# Patient Record
Sex: Female | Born: 1975 | Race: Black or African American | Hispanic: No | Smoking: Never smoker
Health system: Southern US, Community
[De-identification: ages and names within clinical notes are randomized; demographics above are authoritative.]

## PROBLEM LIST (undated history)

## (undated) DIAGNOSIS — Z789 Other specified health status: Secondary | ICD-10-CM

## (undated) DIAGNOSIS — D649 Anemia, unspecified: Secondary | ICD-10-CM

## (undated) HISTORY — PX: NO PAST SURGERIES: SHX2092

---

## 2001-11-20 ENCOUNTER — Emergency Department (HOSPITAL_COMMUNITY): Admission: EM | Admit: 2001-11-20 | Discharge: 2001-11-21 | Payer: Self-pay | Admitting: Emergency Medicine

## 2001-11-21 ENCOUNTER — Encounter: Payer: Self-pay | Admitting: Emergency Medicine

## 2001-11-25 ENCOUNTER — Emergency Department (HOSPITAL_COMMUNITY): Admission: EM | Admit: 2001-11-25 | Discharge: 2001-11-25 | Payer: Self-pay | Admitting: Emergency Medicine

## 2003-01-28 ENCOUNTER — Other Ambulatory Visit: Admission: RE | Admit: 2003-01-28 | Discharge: 2003-01-28 | Payer: Self-pay | Admitting: Gynecology

## 2003-07-04 ENCOUNTER — Inpatient Hospital Stay (HOSPITAL_COMMUNITY): Admission: AD | Admit: 2003-07-04 | Discharge: 2003-07-04 | Payer: Self-pay | Admitting: Obstetrics & Gynecology

## 2003-08-05 ENCOUNTER — Inpatient Hospital Stay (HOSPITAL_COMMUNITY): Admission: AD | Admit: 2003-08-05 | Discharge: 2003-08-07 | Payer: Self-pay | Admitting: Obstetrics & Gynecology

## 2003-08-08 ENCOUNTER — Encounter: Admission: RE | Admit: 2003-08-08 | Discharge: 2003-09-07 | Payer: Self-pay | Admitting: Obstetrics & Gynecology

## 2004-12-05 ENCOUNTER — Other Ambulatory Visit: Admission: RE | Admit: 2004-12-05 | Discharge: 2004-12-05 | Payer: Self-pay | Admitting: Obstetrics and Gynecology

## 2008-03-04 ENCOUNTER — Emergency Department (HOSPITAL_COMMUNITY): Admission: EM | Admit: 2008-03-04 | Discharge: 2008-03-04 | Payer: Self-pay | Admitting: Emergency Medicine

## 2010-12-08 NOTE — H&P (Signed)
Pamela Bernard, Pamela Bernard                           ACCOUNT NO.:  [id number]   MEDICAL RECORD NO.:  [id number]                   PATIENT TYPE:  INP   LOCATION:  9175                                 FACILITY:  [facility]   PHYSICIAN:  Roseanna Rainbow, M.D.         DATE OF BIRTH:  06-09-1976   DATE OF ADMISSION:  08/05/2003  DATE OF DISCHARGE:                                HISTORY & PHYSICAL   CHIEF COMPLAINT:  The patient is a 35 year old, gravida 3, para 1, estimated  date of confinement of July 31, 2003, by an early ultrasound, now with an  intrauterine pregnancy at 40+ weeks, and history of favorable Bishop score,  who presents for induction of labor.   HISTORY OF PRESENT ILLNESS:  Antepartum care initially with [facility], transferred to [place].  Risk factors:  The patient  had a genetic amniocentesis with a normal karyotype.  She has a history of a  preterm delivery complicated by possible postpartum hemorrhage.  She also  developed a labial abscess that required I&D during this pregnancy.  Also  remarkable for mild anemia.  Asymptomatic bacteria.   LABORATORY WORK:  Hemoglobin 11.6, hematocrit 33.6, platelet count 252,000.  Blood type O positive.  Antibody screen negative.  RPR nonreactive.  Rubella  immune.  Hepatitis B surface antigen nonreactive.  HIV nonreactive.   PAST OBSTETRICAL/GYNECOLOGICAL HISTORY:  In December of 1997, she was  delivered of a female 5 pounds 11 ounces at 30+ weeks.  Again, questionable  postpartum hemorrhage.  She has a history of a voluntary termination of  pregnancy at 13 weeks.   PAST MEDICAL HISTORY:  She denies.   PAST SURGICAL HISTORY:  She denies.   FAMILY HISTORY:  Remarkable for chronic hypertension.   SOCIAL HISTORY:  No tobacco, ethanol, or substance abuse.   ALLERGIES:  No known drug allergies.   MEDICATIONS:  Prenatal vitamins and ferrous sulfate.   PHYSICAL EXAMINATION:  VITAL SIGNS:  Stable and afebrile.  GENERAL APPEARANCE:  Well developed.  No apparent distress.  ABDOMEN:  Gravida.  Fetal heart tracing reassuring.  Tocodynamometry with  regular contractions.  PELVIC:  Sterile vaginal exam shows at least 2 cm dilated and 60% effaced.  EXTREMITIES:  No cyanosis, clubbing, or edema.   ASSESSMENT:  1. Intrauterine pregnancy at 40+ weeks.  2. Favorable Bishop score.  3. Fetal heart tracing consistent with fetal well being.   PLAN:  1. Admission.  2. Pitocin induction and augmentation.                                               Roseanna Rainbow, M.D.    Judee Clara  D:  08/05/2003  T:  08/05/2003  Job:  [id number]

## 2011-04-20 LAB — URINE CULTURE: Colony Count: >=100000

## 2011-04-20 LAB — CBC
HCT: 38.4
Hemoglobin: 13.1
MCHC: 34.1
MCV: 87
Platelets: 217
RBC: 4.41
RDW: 12.6
WBC: 10.2

## 2011-04-20 LAB — POCT I-STAT, CHEM 8
BUN: 7
Chloride: 100
HCT: 41
Sodium: 132 — ABNORMAL LOW
TCO2: 27

## 2011-04-20 LAB — DIFFERENTIAL
Basophils Absolute: 0
Basophils Relative: 0
Lymphocytes Relative: 6 — ABNORMAL LOW
Neutro Abs: 8.8 — ABNORMAL HIGH

## 2011-04-20 LAB — URINALYSIS, ROUTINE W REFLEX MICROSCOPIC
Bilirubin Urine: NEGATIVE
Glucose, UA: NEGATIVE
Ketones, ur: 15 — AB
Nitrite: POSITIVE — AB
Protein, ur: 30 — AB
Specific Gravity, Urine: 1.01
Urobilinogen, UA: 4 — ABNORMAL HIGH
pH: 6.5

## 2011-04-20 LAB — CULTURE, BLOOD (ROUTINE X 2)

## 2011-04-20 LAB — URINE MICROSCOPIC-ADD ON

## 2011-04-20 LAB — POCT PREGNANCY, URINE: Preg Test, Ur: NEGATIVE

## 2012-04-24 ENCOUNTER — Emergency Department (INDEPENDENT_AMBULATORY_CARE_PROVIDER_SITE_OTHER): Payer: Self-pay

## 2012-04-24 ENCOUNTER — Encounter (HOSPITAL_COMMUNITY): Payer: Self-pay | Admitting: Emergency Medicine

## 2012-04-24 ENCOUNTER — Emergency Department (INDEPENDENT_AMBULATORY_CARE_PROVIDER_SITE_OTHER)
Admission: EM | Admit: 2012-04-24 | Discharge: 2012-04-24 | Disposition: A | Discharge status: Home / Self Care | Payer: Self-pay | Source: Home / Self Care | Attending: Emergency Medicine | Admitting: Emergency Medicine

## 2012-04-24 DIAGNOSIS — S42023A Displaced fracture of shaft of unspecified clavicle, initial encounter for closed fracture: Secondary | ICD-10-CM

## 2012-04-24 MED ORDER — CYCLOBENZAPRINE HCL 10 MG PO TABS: 10.0000 mg | ORAL_TABLET | Freq: Two times a day (BID) | ORAL | Status: DC | PRN

## 2012-04-24 MED ORDER — HYDROCODONE-IBUPROFEN 7.5-200 MG PO TABS: 1.0000 | ORAL_TABLET | Freq: Three times a day (TID) | ORAL | Status: DC | PRN

## 2012-04-24 NOTE — ED Provider Notes (Signed)
History     CSN: [id number]  Arrival date & time 04/24/12  1057   First MD Initiated Contact with Patient 04/24/12 1058      Chief Complaint  Patient presents with  . Shoulder Pain    (Consider location/radiation/quality/duration/timing/severity/associated sxs/prior treatment) HPI Comments: Patient reports being on ladder yesterday, lost balance and fell to the ground.  Landed on grassy area.  Landed on left shoulder.  Left clavicle deformity.  Denies numbness, no tingling. Obvious deformity to the mid region area of her left clavicle. She did apply some ice yesterday. Patient denies any further symptoms including chest pains, shortness of breath. Able to wiggle her fingers and move her wrist up and down.   Patient is a 36 y.o. female presenting with shoulder pain. The history is provided by the patient.  Shoulder Pain This is a new problem. The problem occurs constantly. The problem has been gradually worsening. Exacerbated by: Movement of arm. Nothing relieves the symptoms. The treatment provided mild relief.    History reviewed. No pertinent past medical history.  History reviewed. No pertinent past surgical history.  No family history on file.  History  Substance Use Topics  . Smoking status: Never Smoker   . Smokeless tobacco: Not on file  . Alcohol Use: No    OB History    Grav Para Term Preterm Abortions TAB SAB Ect Mult Living                  Review of Systems  Constitutional: Negative for chills, activity change and appetite change.  Musculoskeletal: Negative for myalgias, back pain, joint swelling and arthralgias.  Skin: Negative for color change and wound.  Neurological: Negative for weakness and numbness.    Allergies  Review of patient's allergies indicates no known allergies.  Home Medications   Current Outpatient Rx  Name Route Sig Dispense Refill  . IBUPROFEN 200 MG PO TABS Oral Take 800 mg by mouth every 6 (six) hours as needed.      BP  121/85  Pulse 85  Temp 98.4 F (36.9 C) (Oral)  Resp 16  SpO2 99%  LMP 03/23/2012  Physical Exam  Nursing note and vitals reviewed. Constitutional: She appears well-developed and well-nourished. No distress.  Pulmonary/Chest: Effort normal and breath sounds normal.  Musculoskeletal: She exhibits tenderness.       Left shoulder: She exhibits decreased range of motion.       Arms: Neurological: She is alert.  Skin: Skin is warm. No rash noted. No erythema.    ED Course  Procedures (including critical care time)  Labs Reviewed - No data to display Dg Clavicle Left  04/24/2012  *RADIOLOGY REPORT*  Clinical Data: Post fall onto left shoulder yesterday  LEFT CLAVICLE - 2+ VIEWS  Comparison: None.  Findings: There is a comminuted, displaced fracture of the mid aspect of the left clavicle with approximately 1.5 cm of foreshortening of the fracture fragments.  There is an approximately osseous fragment adjacent to the fracture site which measures approximately 3.5 cm in length.  There is expected adjacent soft tissue swelling.  No radiopaque foreign body. Limited visualization of the adjacent thorax is normal.  IMPRESSION: Comminuted, displaced fracture of the mid aspect of the left clavicle.   Original Report Authenticated By: Waynard Reeds, M.D.      No diagnosis found.    MDM   Displaced and angulated midshaft clavicle fracture. Have discussed management and followup with Dr. Ranell Patrick, he will discuss further  treatment options with patient Tuesday. Patient will be placed in a sling, pain management and muscle relaxers and cold therapy patient understands treatment plan and followup care. No neural muscular deficits distally 2 injured Clavicle.       Jimmie Molly, MD 04/24/12 1224

## 2012-04-24 NOTE — ED Notes (Signed)
Left shoulder pain.  Patient reports being on ladder yesterday, lost balance and fell to the ground.  Landed on grassy area.  Landed on left shoulder.  Left clavicle deformity.  Sensation normal in left hand, no numbness, no tingling, pulses 2 plus left radial pulse.  Fingers warm to touch

## 2012-05-07 ENCOUNTER — Encounter (HOSPITAL_COMMUNITY): Payer: Self-pay | Admitting: *Deleted

## 2012-05-07 ENCOUNTER — Encounter (HOSPITAL_COMMUNITY): Payer: Self-pay

## 2012-05-08 MED ORDER — CEFAZOLIN SODIUM-DEXTROSE 2-3 GM-% IV SOLR
2.0000 g | INTRAVENOUS | Status: AC
  Administered 2012-05-09: 2 g via INTRAVENOUS
  Filled 2012-05-08: qty 50

## 2012-05-08 NOTE — H&P (Signed)
CC: left shoulder pain HPI: pt fell about 2 weeks ago onto left shoulder and diagnosed with left clavicle fracture Pt has had continued moderate pain with obvious deformity and tenting of the skin over the left clavicle PMH: negative Social: no smoker, occasional etoh Family: non  Contributory ROS: pain with rom left shoulder otherwise negative PE: alert and appropriate 37 y/o female in no acute distress Cervical spine: full rom cranial nerves 2-12 intact Left shoulder: obvious deformity left midshaft clavicle fracture  nv intact distally Good rom below 90 degrees with left upper extremity Chest active breath sounds bilaterally nv intact bilateral upper extremities X-rays: left clavicle fracture with shortening and displacement Assessment: left clavicle fracture with shortening and displacement Plan: ORIF pin vs plate left clavicle

## 2012-05-09 ENCOUNTER — Ambulatory Visit (HOSPITAL_COMMUNITY): Payer: Self-pay | Admitting: Certified Registered Nurse Anesthetist

## 2012-05-09 ENCOUNTER — Ambulatory Visit (HOSPITAL_COMMUNITY): Payer: Self-pay

## 2012-05-09 ENCOUNTER — Encounter (HOSPITAL_COMMUNITY)
Admission: RE | Disposition: A | Discharge status: Home / Self Care | Payer: Self-pay | Source: Ambulatory Visit | Attending: Orthopedic Surgery

## 2012-05-09 ENCOUNTER — Encounter (HOSPITAL_COMMUNITY): Payer: Self-pay | Admitting: *Deleted

## 2012-05-09 ENCOUNTER — Encounter (HOSPITAL_COMMUNITY): Payer: Self-pay | Admitting: Certified Registered Nurse Anesthetist

## 2012-05-09 ENCOUNTER — Ambulatory Visit (HOSPITAL_COMMUNITY)
Admission: RE | Admit: 2012-05-09 | Discharge: 2012-05-09 | Disposition: A | Discharge status: Home / Self Care | Payer: Self-pay | Source: Ambulatory Visit | Attending: Orthopedic Surgery | Admitting: Orthopedic Surgery

## 2012-05-09 DIAGNOSIS — W19XXXA Unspecified fall, initial encounter: Secondary | ICD-10-CM | POA: Insufficient documentation

## 2012-05-09 DIAGNOSIS — S42023A Displaced fracture of shaft of unspecified clavicle, initial encounter for closed fracture: Secondary | ICD-10-CM | POA: Insufficient documentation

## 2012-05-09 HISTORY — DX: Other specified health status: Z78.9

## 2012-05-09 HISTORY — PX: ORIF CLAVICULAR FRACTURE: SHX5055

## 2012-05-09 LAB — CBC
HCT: 41.6 % (ref 36.0–46.0)
Hemoglobin: 13.9 g/dL (ref 12.0–15.0)
MCV: 89.8 fL (ref 78.0–100.0)
RDW: 12.4 % (ref 11.5–15.5)
WBC: 6.7 10*3/uL (ref 4.0–10.5)

## 2012-05-09 SURGERY — OPEN REDUCTION INTERNAL FIXATION (ORIF) CLAVICULAR FRACTURE
Anesthesia: General | Site: Shoulder | Laterality: Left | Wound class: Clean

## 2012-05-09 MED ORDER — HYDROMORPHONE HCL PF 1 MG/ML IJ SOLN
INTRAMUSCULAR | Status: AC
  Administered 2012-05-09: 0.5 mg via INTRAVENOUS
  Filled 2012-05-09: qty 1

## 2012-05-09 MED ORDER — GLYCOPYRROLATE 0.2 MG/ML IJ SOLN
INTRAMUSCULAR | Status: DC | PRN
  Administered 2012-05-09: 0.6 mg via INTRAVENOUS

## 2012-05-09 MED ORDER — FENTANYL CITRATE 0.05 MG/ML IJ SOLN
INTRAMUSCULAR | Status: AC
  Filled 2012-05-09: qty 2

## 2012-05-09 MED ORDER — LIDOCAINE HCL (CARDIAC) 20 MG/ML IV SOLN
INTRAVENOUS | Status: DC | PRN
  Administered 2012-05-09: 40 mg via INTRAVENOUS

## 2012-05-09 MED ORDER — ACETAMINOPHEN 10 MG/ML IV SOLN: 1000.0000 mg | Freq: Once | INTRAVENOUS | Status: DC

## 2012-05-09 MED ORDER — CHLORHEXIDINE GLUCONATE 4 % EX LIQD: 60.0000 mL | Freq: Once | CUTANEOUS | Status: DC

## 2012-05-09 MED ORDER — MIDAZOLAM HCL 2 MG/2ML IJ SOLN
INTRAMUSCULAR | Status: AC
  Filled 2012-05-09: qty 4

## 2012-05-09 MED ORDER — MIDAZOLAM HCL 2 MG/2ML IJ SOLN: 1.0000 mg | INTRAMUSCULAR | Status: DC | PRN

## 2012-05-09 MED ORDER — KETOROLAC TROMETHAMINE 30 MG/ML IJ SOLN
30.0000 mg | Freq: Once | INTRAMUSCULAR | Status: AC
  Administered 2012-05-09: 30 mg via INTRAVENOUS
  Filled 2012-05-09: qty 1

## 2012-05-09 MED ORDER — OXYCODONE-ACETAMINOPHEN 5-325 MG PO TABS: 1.0000 | ORAL_TABLET | ORAL | Status: DC | PRN

## 2012-05-09 MED ORDER — OXYCODONE HCL 5 MG/5ML PO SOLN: 5.0000 mg | Freq: Once | ORAL | Status: DC | PRN

## 2012-05-09 MED ORDER — NEOSTIGMINE METHYLSULFATE 1 MG/ML IJ SOLN
INTRAMUSCULAR | Status: DC | PRN
  Administered 2012-05-09: 4 mg via INTRAVENOUS

## 2012-05-09 MED ORDER — MIDAZOLAM HCL 2 MG/2ML IJ SOLN
INTRAMUSCULAR | Status: AC
  Filled 2012-05-09: qty 2

## 2012-05-09 MED ORDER — ROCURONIUM BROMIDE 100 MG/10ML IV SOLN
INTRAVENOUS | Status: DC | PRN
  Administered 2012-05-09: 40 mg via INTRAVENOUS

## 2012-05-09 MED ORDER — OXYCODONE HCL 5 MG PO TABS: 5.0000 mg | ORAL_TABLET | Freq: Once | ORAL | Status: DC | PRN

## 2012-05-09 MED ORDER — FENTANYL CITRATE 0.05 MG/ML IJ SOLN: 50.0000 ug | Freq: Once | INTRAMUSCULAR | Status: DC

## 2012-05-09 MED ORDER — ESMOLOL HCL 10 MG/ML IV SOLN
INTRAVENOUS | Status: DC | PRN
  Administered 2012-05-09: 10 mg via INTRAVENOUS

## 2012-05-09 MED ORDER — ONDANSETRON HCL 4 MG/2ML IJ SOLN
INTRAMUSCULAR | Status: DC | PRN
  Administered 2012-05-09: 4 mg via INTRAVENOUS

## 2012-05-09 MED ORDER — BUPIVACAINE-EPINEPHRINE 0.25% -1:200000 IJ SOLN
INTRAMUSCULAR | Status: DC | PRN
  Administered 2012-05-09: 5 mL

## 2012-05-09 MED ORDER — BUPIVACAINE-EPINEPHRINE PF 0.25-1:200000 % IJ SOLN
INTRAMUSCULAR | Status: AC
  Filled 2012-05-09: qty 30

## 2012-05-09 MED ORDER — LACTATED RINGERS IV SOLN
INTRAVENOUS | Status: DC
  Administered 2012-05-09: 12:00:00 via INTRAVENOUS

## 2012-05-09 MED ORDER — METHOCARBAMOL 500 MG PO TABS: 500.0000 mg | ORAL_TABLET | Freq: Three times a day (TID) | ORAL | Status: DC | PRN

## 2012-05-09 MED ORDER — HYDROMORPHONE HCL PF 1 MG/ML IJ SOLN
0.2500 mg | INTRAMUSCULAR | Status: DC | PRN
  Administered 2012-05-09 (×3): 0.5 mg via INTRAVENOUS

## 2012-05-09 MED ORDER — PROPOFOL 10 MG/ML IV BOLUS
INTRAVENOUS | Status: DC | PRN
  Administered 2012-05-09: 180 mg via INTRAVENOUS

## 2012-05-09 MED ORDER — BUPIVACAINE-EPINEPHRINE PF 0.5-1:200000 % IJ SOLN
INTRAMUSCULAR | Status: DC | PRN
  Administered 2012-05-09: 25 mL

## 2012-05-09 MED ORDER — ARTIFICIAL TEARS OP OINT
TOPICAL_OINTMENT | OPHTHALMIC | Status: DC | PRN
  Administered 2012-05-09: 1 via OPHTHALMIC

## 2012-05-09 MED ORDER — PROMETHAZINE HCL 25 MG/ML IJ SOLN: 6.2500 mg | INTRAMUSCULAR | Status: DC | PRN

## 2012-05-09 MED ORDER — DEXAMETHASONE SODIUM PHOSPHATE 4 MG/ML IJ SOLN
INTRAMUSCULAR | Status: DC | PRN
  Administered 2012-05-09: 4 mg

## 2012-05-09 MED ORDER — LACTATED RINGERS IV SOLN
INTRAVENOUS | Status: DC | PRN
  Administered 2012-05-09 (×2): via INTRAVENOUS

## 2012-05-09 MED ORDER — FENTANYL CITRATE 0.05 MG/ML IJ SOLN
INTRAMUSCULAR | Status: DC | PRN
  Administered 2012-05-09: 50 ug via INTRAVENOUS
  Administered 2012-05-09: 25 ug via INTRAVENOUS

## 2012-05-09 MED ORDER — MIDAZOLAM HCL 5 MG/5ML IJ SOLN
INTRAMUSCULAR | Status: DC | PRN
  Administered 2012-05-09: 2 mg via INTRAVENOUS

## 2012-05-09 MED ORDER — MUPIROCIN 2 % EX OINT
TOPICAL_OINTMENT | Freq: Two times a day (BID) | CUTANEOUS | Status: DC
  Administered 2012-05-09: 1 via NASAL
  Filled 2012-05-09 (×2): qty 22

## 2012-05-09 SURGICAL SUPPLY — 45 items
BIT DRILL 3.2 STERILE (BIT) ×1
BIT DRILL 3.2MM STRL (BIT) ×1 IMPLANT
BIT DRILL Q COUPLING 4.5 (BIT) IMPLANT
BIT DRILL Q/COUPLING 1 (BIT) IMPLANT
CLEANER TIP ELECTROSURG 2X2 (MISCELLANEOUS) ×2 IMPLANT
CLOTH BEACON ORANGE TIMEOUT ST (SAFETY) ×2 IMPLANT
DRAPE C-ARM 42X72 X-RAY (DRAPES) ×2 IMPLANT
DRAPE INCISE IOBAN 66X45 STRL (DRAPES) IMPLANT
DRAPE U-SHAPE 47X51 STRL (DRAPES) ×2 IMPLANT
DRILL BIT 3.2MM STERILE (BIT) ×1
DRSG EMULSION OIL 3X3 NADH (GAUZE/BANDAGES/DRESSINGS) ×2 IMPLANT
DRSG PAD ABDOMINAL 8X10 ST (GAUZE/BANDAGES/DRESSINGS) ×2 IMPLANT
ELECT NEEDLE TIP 2.8 STRL (NEEDLE) ×2 IMPLANT
ELECT REM PT RETURN 9FT ADLT (ELECTROSURGICAL) ×2
ELECTRODE REM PT RTRN 9FT ADLT (ELECTROSURGICAL) ×1 IMPLANT
GLOVE BIOGEL PI ORTHO PRO 7.5 (GLOVE) ×1
GLOVE BIOGEL PI ORTHO PRO SZ8 (GLOVE) ×1
GLOVE ORTHO TXT STRL SZ7.5 (GLOVE) ×2 IMPLANT
GLOVE PI ORTHO PRO STRL 7.5 (GLOVE) ×1 IMPLANT
GLOVE PI ORTHO PRO STRL SZ8 (GLOVE) ×1 IMPLANT
GLOVE SURG ORTHO 8.5 STRL (GLOVE) ×2 IMPLANT
GOWN STRL NON-REIN LRG LVL3 (GOWN DISPOSABLE) IMPLANT
GOWN STRL REIN XL XLG (GOWN DISPOSABLE) ×6 IMPLANT
KIT BASIN OR (CUSTOM PROCEDURE TRAY) ×2 IMPLANT
KIT ROOM TURNOVER OR (KITS) ×2 IMPLANT
MANIFOLD NEPTUNE II (INSTRUMENTS) ×2 IMPLANT
NEEDLE 22X1 1/2 (OR ONLY) (NEEDLE) IMPLANT
NEEDLE HYPO 25GX1X1/2 BEV (NEEDLE) ×2 IMPLANT
NS IRRIG 1000ML POUR BTL (IV SOLUTION) ×2 IMPLANT
PACK SHOULDER (CUSTOM PROCEDURE TRAY) ×2 IMPLANT
PAD ARMBOARD 7.5X6 YLW CONV (MISCELLANEOUS) ×4 IMPLANT
PIN CLAVICLE ASSEMBLY 3.0MM (Pin) ×2 IMPLANT
SLING ARM FOAM STRAP LRG (SOFTGOODS) ×2 IMPLANT
SPONGE GAUZE 4X4 12PLY (GAUZE/BANDAGES/DRESSINGS) ×2 IMPLANT
SPONGE LAP 4X18 X RAY DECT (DISPOSABLE) ×4 IMPLANT
STRIP CLOSURE SKIN 1/2X4 (GAUZE/BANDAGES/DRESSINGS) ×2 IMPLANT
SUCTION FRAZIER TIP 10 FR DISP (SUCTIONS) ×2 IMPLANT
SUT MNCRL AB 4-0 PS2 18 (SUTURE) ×2 IMPLANT
SUT VIC AB 2-0 CT1 27 (SUTURE) ×1
SUT VIC AB 2-0 CT1 TAPERPNT 27 (SUTURE) ×1 IMPLANT
SUT VICRYL 0 CT 1 36IN (SUTURE) IMPLANT
SYR CONTROL 10ML LL (SYRINGE) ×2 IMPLANT
TOWEL OR 17X24 6PK STRL BLUE (TOWEL DISPOSABLE) ×2 IMPLANT
TOWEL OR 17X26 10 PK STRL BLUE (TOWEL DISPOSABLE) ×2 IMPLANT
WATER STERILE IRR 1000ML POUR (IV SOLUTION) ×2 IMPLANT
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
## 2012-05-09 NOTE — Anesthesia Preprocedure Evaluation (Addendum)
Anesthesia Evaluation  Patient identified by MRN, date of birth, ID band Patient awake    Reviewed: Allergy & Precautions, H&P , NPO status , Patient's Chart, lab work & pertinent test results  History of Anesthesia Complications (+) PROLONGED EMERGENCE  Airway Mallampati: I TM Distance: >3 FB Neck ROM: Full    Dental   Pulmonary  breath sounds clear to auscultation        Cardiovascular Rhythm:Regular Rate:Normal     Neuro/Psych    GI/Hepatic   Endo/Other    Renal/GU      Musculoskeletal   Abdominal   Peds  Hematology   Anesthesia Other Findings   Reproductive/Obstetrics                          Anesthesia Physical Anesthesia Plan  ASA: I  Anesthesia Plan: General   Post-op Pain Management:    Induction: Intravenous  Airway Management Planned: Oral ETT  Additional Equipment:   Intra-op Plan:   Post-operative Plan: Extubation in OR  Informed Consent: I have reviewed the patients History and Physical, chart, labs and discussed the procedure including the risks, benefits and alternatives for the proposed anesthesia with the patient or authorized representative who has indicated his/her understanding and acceptance.     Plan Discussed with: CRNA and Surgeon  Anesthesia Plan Comments:         Anesthesia Quick Evaluation

## 2012-05-09 NOTE — Anesthesia Procedure Notes (Signed)
Anesthesia Regional Block:  Interscalene brachial plexus block  Pre-Anesthetic Checklist: ,, timeout performed, Correct Patient, Correct Site, Correct Laterality, Correct Procedure, Correct Position, site marked, Risks and benefits discussed,  Surgical consent,  Pre-op evaluation,  At surgeon's request and post-op pain management  Laterality: Left  Prep: chloraprep       Needles:  Injection technique: Single-shot  Needle Type: Echogenic Stimulator Needle     Needle Length: 5cm 5 cm Needle Gauge: 22 and 22 G    Additional Needles:  Procedures: ultrasound guided and nerve stimulator Interscalene brachial plexus block  Nerve Stimulator or Paresthesia:  Response: 0.48 mA,   Additional Responses:   Narrative:  Start time: 05/09/2012 12:17 PM End time: 05/09/2012 12:35 PM Injection made incrementally with aspirations every 5 mL. Anesthesiologist: Dr Gypsy Balsam  Additional Notes: 2440-1027 L ISB POP CHG prpe, sterile tech #22 stim needle/US needle with stim down to .48ma and good [place] visualization Pix in chart Marc .5% w/epi 25cc+ decadron 4mg  infiltrated Multiple neg asp No compl Dr Gypsy Balsam    Interscalene brachial plexus block

## 2012-05-09 NOTE — Brief Op Note (Signed)
05/09/2012  3:03 PM  PATIENT:  Champ Mungo  36 y.o. female  PRE-OPERATIVE DIAGNOSIS:  LEFT CLAVICLE FRACTURE, DISPLACED AND COMMINUTED AND SHORTENED   POST-OPERATIVE DIAGNOSIS:  LEFT CLAVICLE FRACTURE, DISPLACED AND COMMINUTED AND SHORTENED  PROCEDURE:  Procedure(s) (LRB) with comments: OPEN REDUCTION INTERNAL FIXATION (ORIF) CLAVICULAR FRACTURE (Left) - clavicle pinning DePuy clavicle pin  SURGEON:  Surgeon(s) and Role:    * Verlee Rossetti, MD - Primary  PHYSICIAN ASSISTANT:   ASSISTANTS: Thea Gist, PA-C   ANESTHESIA:   local, regional and general  EBL:  Total I/O In: 1000 [I.V.:1000] Out: -   BLOOD ADMINISTERED:none  DRAINS: none   LOCAL MEDICATIONS USED:  MARCAINE     SPECIMEN:  No Specimen  DISPOSITION OF SPECIMEN:  N/A  COUNTS:  YES  TOURNIQUET:  * No tourniquets in log *  DICTATION: .Other Dictation: Dictation Number [id number]  PLAN OF CARE: Discharge to home after PACU  PATIENT DISPOSITION:  PACU - hemodynamically stable.   Delay start of Pharmacological VTE agent (>24hrs) due to surgical blood loss or risk of bleeding: not applicable

## 2012-05-09 NOTE — Transfer of Care (Signed)
Immediate Anesthesia Transfer of Care Note  Patient: Pamela Bernard  Procedure(s) Performed: Procedure(s) (LRB) with comments: OPEN REDUCTION INTERNAL FIXATION (ORIF) CLAVICULAR FRACTURE (Left) - clavicle pinning  Patient Location: PACU  Anesthesia Type: General  Level of Consciousness: awake, alert  and oriented  Airway & Oxygen Therapy: Patient Spontanous Breathing and Patient connected to nasal cannula oxygen  Post-op Assessment: Report given to PACU RN, Post -op Vital signs reviewed and stable and Patient moving all extremities X 4  Post vital signs: Reviewed and stable  Complications: No apparent anesthesia complications

## 2012-05-09 NOTE — Progress Notes (Signed)
Belly button ring given to volunteer Britta Mccreedy at [employer] area volunteer desk states they will give to patients family upon return to [place] waiting area
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
## 2012-05-09 NOTE — Anesthesia Postprocedure Evaluation (Signed)
Anesthesia Post Note  Patient: Pamela Bernard  Procedure(s) Performed: Procedure(s) (LRB): OPEN REDUCTION INTERNAL FIXATION (ORIF) CLAVICULAR FRACTURE (Left)  Anesthesia type: general  Patient location: PACU  Post pain: Pain level controlled  Post assessment: Patient's Cardiovascular Status Stable  Last Vitals:  Filed Vitals:   05/09/12 1555  BP: 111/75  Pulse: 88  Temp:   Resp: 16    Post vital signs: Reviewed and stable  Level of consciousness: sedated  Complications: No apparent anesthesia complications

## 2012-05-09 NOTE — Interval H&P Note (Signed)
History and Physical Interval Note:  05/09/2012 12:37 PM  Pamela Bernard  has presented today for surgery, with the diagnosis of LEFT CLAVICLE FRACTURE  The various methods of treatment have been discussed with the patient and family. After consideration of risks, benefits and other options for treatment, the patient has consented to  Procedure(s) (LRB) with comments: OPEN REDUCTION INTERNAL FIXATION (ORIF) CLAVICULAR FRACTURE (Left) as a surgical intervention .  The patient's history has been reviewed, patient examined, no change in status, stable for surgery.  I have reviewed the patient's chart and labs.  Questions were answered to the patient's satisfaction.     [name],STEVEN R

## 2012-05-10 NOTE — Op Note (Signed)
NAMEBALEY, SHANDS NO.:  [id number]  MEDICAL RECORD NO.:  [id number]  LOCATION:  [facility]                         FACILITY:  [facility]  PHYSICIAN:  Almedia Balls. Ranell Patrick, M.D. DATE OF BIRTH:  [birth year]  DATE OF PROCEDURE:  05/09/2012 DATE OF DISCHARGE:  05/09/2012                              OPERATIVE REPORT   PREOPERATIVE DIAGNOSIS:  Displaced comminuted and shortened left clavicle fracture.  POSTOPERATIVE DIAGNOSIS:  Displaced comminuted and shortened left clavicle fracture.  PROCEDURE PERFORMED:  Open reduction and internal fixation using a DePuy IM pin, left clavicle fracture.  ATTENDING SURGEON:  Almedia Balls. Ranell Patrick, M.D.  ASSISTANT:  Donnie Coffin. Dixon, PA-C who was scrubbed during the entire procedure and necessary for satisfactory completion of surgery.  ANESTHESIA:  General anesthesia was used plus interscalene block and local anesthesia.  ESTIMATED BLOOD LOSS:  Minimal.  FLUID REPLACEMENT:  1000 mL crystalloid.  INSTRUMENT COUNTS:  Correct.  COMPLICATIONS:  There were no complications.  Perioperative antibiotics given.  INDICATIONS:  The patient is a 36 year old who injured her left clavicle and then fall to the ground.  The patient presented with a shortened comminuted and displaced clavicle fracture with extreme displacement and tenting of the skin.  I discussed the options for management with the patient.  She elected to proceed with surgical management.  Risks and benefits of surgery, nonsurgical options were discussed.  Informed consent was obtained.  DESCRIPTION OF PROCEDURE:  After adequate level of anesthesia achieved, the patient was positioned in modified beach-chair position.  Left shoulder correctly identified, sterilely prepped, and draped in usual manner.  Time-out called.  We entered the shoulder using Langer's line skin incision directly overlying the fracture site which was junction middle and medial third of the clavicle.   Dissection down to subcutaneous tissues using Metzenbaum.  We identified the fractured clavicle.  We draped the C-arm into the field.  We drilled the medial and lateral fragments with 3.2 drill bit and then tapped with a 3.0 DePuy clavicle tap.  We then went ahead and retrograded the clavicle pin out the lateral side, reduced the fracture, and then advanced the pin across the fracture site gaining anatomic reduction.  We placed our medial and lateral nuts, clipped the pin, flushed with the lateral nut, and then rasped that smooth with the clavicle in excellent alignment and pinned in position up against the posterior clavicle.  We thoroughly irrigated all wounds.  We then took available bone graft from the butterfly fragment that was removed that was basically vertical, and sticking up into the skin we took that out and morselized that to place in the defect area underneath the fracture site.  There is good muscular coverage all around the clavicle.  Repaired the muscular layer with interrupted 0 Vicryl suture followed by 2-0 Vicryl subcutaneous closure and 4-0 Monocryl for skin.  Steri-Strips applied followed by sterile dressing and shoulder sling.  The patient tolerated the procedure well.     Almedia Balls. Ranell Patrick, M.D.     SRN/MEDQ  D:  05/09/2012  T:  05/10/2012  Job:  [id number]

## 2012-05-13 ENCOUNTER — Encounter (HOSPITAL_COMMUNITY): Payer: Self-pay | Admitting: Orthopedic Surgery

## 2012-05-15 ENCOUNTER — Encounter (HOSPITAL_COMMUNITY): Payer: Self-pay

## 2012-12-31 ENCOUNTER — Encounter (HOSPITAL_COMMUNITY): Payer: Self-pay | Admitting: Pharmacy Technician

## 2013-01-06 ENCOUNTER — Encounter (HOSPITAL_COMMUNITY)
Admission: RE | Admit: 2013-01-06 | Discharge: 2013-01-06 | Disposition: A | Discharge status: Home / Self Care | Payer: Self-pay | Source: Ambulatory Visit | Attending: Orthopedic Surgery | Admitting: Orthopedic Surgery

## 2013-01-06 ENCOUNTER — Encounter (HOSPITAL_COMMUNITY): Payer: Self-pay

## 2013-01-06 HISTORY — DX: Anemia, unspecified: D64.9

## 2013-01-06 LAB — CBC
HCT: 40 % (ref 36.0–46.0)
Hemoglobin: 13.2 g/dL (ref 12.0–15.0)
MCH: 29.7 pg (ref 26.0–34.0)
RBC: 4.45 MIL/uL (ref 3.87–5.11)

## 2013-01-06 LAB — HCG, SERUM, QUALITATIVE: Preg, Serum: NEGATIVE

## 2013-01-06 LAB — SURGICAL PCR SCREEN
MRSA, PCR: NEGATIVE
Staphylococcus aureus: NEGATIVE

## 2013-01-06 NOTE — Pre-Procedure Instructions (Addendum)
Lenyx Boody  01/06/2013   Your procedure is scheduled on:  01/09/13  Report to [facility] at 930AM.  Call this number if you have problems the morning of surgery: [phone]   Remember:   Do not eat food or drink liquids after midnight.   Take these medicines the morning of surgery with A SIP OF WATER:none   Do not wear jewelry, make-up or nail polish.  Do not wear lotions, powders, or perfumes. You may wear deodorant.  Do not shave 48 hours prior to surgery. Men may shave face and neck.  Do not bring valuables to the hospital.  [facility] is not responsible                   for any belongings or valuables.  Contacts, dentures or bridgework may not be worn into surgery.  Leave suitcase in the car. After surgery it may be brought to your room.  For patients admitted to the hospital, checkout time is 11:00 AM the day of  discharge.   Patients discharged the day of surgery will not be allowed to drive  home.  Name and phone number of your driver:  Special Instructions: Shower using CHG 2 nights before surgery and the night before surgery.  If you shower the day of surgery use CHG.  Use special wash - you have one bottle of CHG for all showers.  You should use approximately 1/3 of the bottle for each shower.   Please read over the following fact sheets that you were given: Pain Booklet, Coughing and Deep Breathing, MRSA Information and Surgical Site Infection Prevention

## 2013-01-07 NOTE — H&P (Signed)
Pamela Bernard is an 37 y.o. female.    Chief Complaint: left shoulder injury  HPI: Pt is a 37 y.o. female with a left clavicle fracture requiring clavicle pin placement. Patient has healed her fracture and now presents for clavicle pin removal. Denies any other symptoms or issues  PCP:  No primary provider on file.  D/C Plans:  Home   PMH: Past Medical History  Diagnosis Date  . No pertinent past medical history   . Anemia     hx    PSH: Past Surgical History  Procedure Laterality Date  . No past surgeries    . Orif clavicular fracture  05/09/2012    Procedure: OPEN REDUCTION INTERNAL FIXATION (ORIF) CLAVICULAR FRACTURE;  Surgeon: Verlee Rossetti, MD;  Location: [facility] OR;  Service: Orthopedics;  Laterality: Left;  clavicle pinning    Social History:  reports that she has never smoked. She does not have any smokeless tobacco history on file. She reports that she does not drink alcohol or use illicit drugs.  Allergies:  No Known Allergies  Medications: No current facility-administered medications for this encounter.   No current outpatient prescriptions on file.    Results for orders placed during the hospital encounter of 01/06/13 (from the past 48 hour(s))  SURGICAL PCR SCREEN     Status: None   Collection Time    01/06/13  8:22 AM      Result Value Range   MRSA, PCR NEGATIVE  NEGATIVE   Staphylococcus aureus NEGATIVE  NEGATIVE   Comment:            The Xpert SA Assay (FDA     approved for NASAL specimens     in patients over 2 years of age),     is one component of     a comprehensive surveillance     program.  Test performance has     been validated by [employer] for patients greater     than or equal to 45 year old.     It is not intended     to diagnose infection nor to     guide or monitor treatment.  CBC     Status: None   Collection Time    01/06/13  8:28 AM      Result Value Range   WBC 5.4  4.0 - 10.5 K/uL   RBC 4.45  3.87 - 5.11 MIL/uL   Hemoglobin 13.2  12.0 - 15.0 g/dL   HCT 96.2  95.2 - 84.1 %   MCV 89.9  78.0 - 100.0 fL   MCH 29.7  26.0 - 34.0 pg   MCHC 33.0  30.0 - 36.0 g/dL   RDW 32.4  40.1 - 02.7 %   Platelets 194  150 - 400 K/uL  HCG, SERUM, QUALITATIVE     Status: None   Collection Time    01/06/13  8:28 AM      Result Value Range   Preg, Serum NEGATIVE  NEGATIVE   Comment:            THE SENSITIVITY OF THIS     METHODOLOGY IS >10 mIU/mL.   No results found.  ROS: Pain with rom of the left upper extremity  Physical Exam: Alert and oriented 37 y.o. female in no acute distress Cranial nerves 2-12 intact Cervical spine: full rom with no tenderness, nv intact distally Chest: active breath sounds bilaterally, no wheeze rhonchi or rales Heart: regular rate  and rhythm, no murmur Abd: non tender non distended with active bowel sounds Hip is stable with rom  Good rom of left upper extremity nv intact distally No rashes or edema  Assessment/Plan Assessment: healed left clavicle fracture  Plan: Patient will undergo a left clavicle pin removal by Dr. Ranell Patrick at [facility]. Risks benefits and expectations were discussed with the patient. Patient understand risks, benefits and expectations and wishes to proceed.

## 2013-01-08 MED ORDER — CEFAZOLIN SODIUM-DEXTROSE 2-3 GM-% IV SOLR
2.0000 g | INTRAVENOUS | Status: AC
  Administered 2013-01-09: 2 g via INTRAVENOUS
  Filled 2013-01-08: qty 50

## 2013-01-08 NOTE — Progress Notes (Signed)
Surgery time has changed again.  Left message on pt's voicemail instructing her to be here at 10:30am tomorrow.  Asked patient to call front desk to let us know that she got the message.

## 2013-01-08 NOTE — Progress Notes (Signed)
Patient notified of surgery time change.  Pt instructed to be here at Short Stay at 11:30 tomorrow.

## 2013-01-09 ENCOUNTER — Encounter (HOSPITAL_COMMUNITY): Payer: Self-pay | Admitting: Anesthesiology

## 2013-01-09 ENCOUNTER — Encounter (HOSPITAL_COMMUNITY)
Admission: RE | Disposition: A | Discharge status: Home / Self Care | Payer: Self-pay | Source: Ambulatory Visit | Attending: Orthopedic Surgery

## 2013-01-09 ENCOUNTER — Ambulatory Visit (HOSPITAL_COMMUNITY)
Admission: RE | Admit: 2013-01-09 | Discharge: 2013-01-09 | Disposition: A | Discharge status: Home / Self Care | Payer: MEDICAID | Source: Ambulatory Visit | Attending: Orthopedic Surgery | Admitting: Orthopedic Surgery

## 2013-01-09 ENCOUNTER — Ambulatory Visit (HOSPITAL_COMMUNITY): Payer: Self-pay | Admitting: Anesthesiology

## 2013-01-09 ENCOUNTER — Encounter (HOSPITAL_COMMUNITY): Payer: Self-pay | Admitting: *Deleted

## 2013-01-09 DIAGNOSIS — IMO0001 Reserved for inherently not codable concepts without codable children: Secondary | ICD-10-CM | POA: Insufficient documentation

## 2013-01-09 DIAGNOSIS — Z472 Encounter for removal of internal fixation device: Secondary | ICD-10-CM | POA: Insufficient documentation

## 2013-01-09 HISTORY — PX: HARDWARE REMOVAL: SHX979

## 2013-01-09 SURGERY — REMOVAL, HARDWARE
Anesthesia: Monitor Anesthesia Care | Laterality: Left | Wound class: Clean

## 2013-01-09 MED ORDER — ONDANSETRON HCL 4 MG/2ML IJ SOLN: 4.0000 mg | Freq: Four times a day (QID) | INTRAMUSCULAR | Status: DC | PRN

## 2013-01-09 MED ORDER — OXYCODONE HCL 5 MG/5ML PO SOLN: 5.0000 mg | Freq: Once | ORAL | Status: AC | PRN

## 2013-01-09 MED ORDER — KETOROLAC TROMETHAMINE 30 MG/ML IJ SOLN
INTRAMUSCULAR | Status: DC | PRN
  Administered 2013-01-09: 30 mg via INTRAVENOUS

## 2013-01-09 MED ORDER — MIDAZOLAM HCL 5 MG/5ML IJ SOLN
INTRAMUSCULAR | Status: DC | PRN
  Administered 2013-01-09: 2 mg via INTRAVENOUS

## 2013-01-09 MED ORDER — FENTANYL CITRATE 0.05 MG/ML IJ SOLN
25.0000 ug | INTRAMUSCULAR | Status: DC | PRN
  Administered 2013-01-09 (×3): 50 ug via INTRAVENOUS

## 2013-01-09 MED ORDER — OXYCODONE HCL 5 MG PO TABS
ORAL_TABLET | ORAL | Status: AC
  Filled 2013-01-09: qty 1

## 2013-01-09 MED ORDER — CHLORHEXIDINE GLUCONATE 4 % EX LIQD: 60.0000 mL | Freq: Once | CUTANEOUS | Status: DC

## 2013-01-09 MED ORDER — PROPOFOL INFUSION 10 MG/ML OPTIME
INTRAVENOUS | Status: DC | PRN
  Administered 2013-01-09: 75 ug/kg/min via INTRAVENOUS

## 2013-01-09 MED ORDER — OXYCODONE HCL 5 MG PO TABS
5.0000 mg | ORAL_TABLET | Freq: Once | ORAL | Status: AC | PRN
  Administered 2013-01-09: 5 mg via ORAL

## 2013-01-09 MED ORDER — HYDROMORPHONE HCL PF 1 MG/ML IJ SOLN
0.2500 mg | INTRAMUSCULAR | Status: DC | PRN
  Administered 2013-01-09 (×2): 0.5 mg via INTRAVENOUS

## 2013-01-09 MED ORDER — LACTATED RINGERS IV SOLN
INTRAVENOUS | Status: DC | PRN
  Administered 2013-01-09: 12:00:00 via INTRAVENOUS

## 2013-01-09 MED ORDER — HYDROMORPHONE HCL PF 1 MG/ML IJ SOLN
INTRAMUSCULAR | Status: AC
  Filled 2013-01-09: qty 1

## 2013-01-09 MED ORDER — ONDANSETRON HCL 4 MG/2ML IJ SOLN
INTRAMUSCULAR | Status: DC | PRN
  Administered 2013-01-09: 4 mg via INTRAVENOUS

## 2013-01-09 MED ORDER — FENTANYL CITRATE 0.05 MG/ML IJ SOLN
INTRAMUSCULAR | Status: DC | PRN
  Administered 2013-01-09 (×3): 50 ug via INTRAVENOUS

## 2013-01-09 MED ORDER — FENTANYL CITRATE 0.05 MG/ML IJ SOLN
INTRAMUSCULAR | Status: AC
  Filled 2013-01-09: qty 4

## 2013-01-09 MED ORDER — LACTATED RINGERS IV SOLN
INTRAVENOUS | Status: DC
  Administered 2013-01-09: 12:00:00 via INTRAVENOUS

## 2013-01-09 MED ORDER — LIDOCAINE HCL (CARDIAC) 20 MG/ML IV SOLN
INTRAVENOUS | Status: DC | PRN
  Administered 2013-01-09: 80 mg via INTRAVENOUS

## 2013-01-09 MED ORDER — BUPIVACAINE-EPINEPHRINE PF 0.25-1:200000 % IJ SOLN
INTRAMUSCULAR | Status: AC
  Filled 2013-01-09: qty 30

## 2013-01-09 MED ORDER — OXYCODONE-ACETAMINOPHEN 5-325 MG PO TABS: 1.0000 | ORAL_TABLET | ORAL | Status: AC | PRN

## 2013-01-09 MED ORDER — BUPIVACAINE-EPINEPHRINE 0.25% -1:200000 IJ SOLN
INTRAMUSCULAR | Status: DC | PRN
  Administered 2013-01-09: 6 mL

## 2013-01-09 MED ORDER — PROPOFOL 10 MG/ML IV BOLUS
INTRAVENOUS | Status: DC | PRN
  Administered 2013-01-09 (×2): 20 mg via INTRAVENOUS

## 2013-01-09 SURGICAL SUPPLY — 49 items
BANDAGE ELASTIC 4 VELCRO ST LF (GAUZE/BANDAGES/DRESSINGS) IMPLANT
BANDAGE ELASTIC 6 VELCRO ST LF (GAUZE/BANDAGES/DRESSINGS) IMPLANT
BANDAGE ESMARK 6X9 LF (GAUZE/BANDAGES/DRESSINGS) IMPLANT
BANDAGE GAUZE ELAST BULKY 4 IN (GAUZE/BANDAGES/DRESSINGS) ×2 IMPLANT
BNDG COHESIVE 4X5 TAN STRL (GAUZE/BANDAGES/DRESSINGS) IMPLANT
BNDG ESMARK 6X9 LF (GAUZE/BANDAGES/DRESSINGS)
CLOTH BEACON ORANGE TIMEOUT ST (SAFETY) ×2 IMPLANT
COVER SURGICAL LIGHT HANDLE (MISCELLANEOUS) ×2 IMPLANT
CUFF TOURNIQUET SINGLE 18IN (TOURNIQUET CUFF) ×2 IMPLANT
CUFF TOURNIQUET SINGLE 24IN (TOURNIQUET CUFF) IMPLANT
CUFF TOURNIQUET SINGLE 34IN LL (TOURNIQUET CUFF) IMPLANT
CUFF TOURNIQUET SINGLE 44IN (TOURNIQUET CUFF) IMPLANT
DRAPE C-ARM 42X72 X-RAY (DRAPES) IMPLANT
DRAPE EXTREMITY T 121X128X90 (DRAPE) IMPLANT
DRAPE INCISE IOBAN 66X45 STRL (DRAPES) IMPLANT
DRAPE LAPAROTOMY T 102X78X121 (DRAPES) ×2 IMPLANT
DRAPE ORTHO SPLIT 77X108 STRL (DRAPES)
DRAPE PROXIMA HALF (DRAPES) IMPLANT
DRAPE SURG ORHT 6 SPLT 77X108 (DRAPES) IMPLANT
DRSG EMULSION OIL 3X3 NADH (GAUZE/BANDAGES/DRESSINGS) ×2 IMPLANT
DRSG PAD ABDOMINAL 8X10 ST (GAUZE/BANDAGES/DRESSINGS) ×2 IMPLANT
ELECT REM PT RETURN 9FT ADLT (ELECTROSURGICAL) ×2
ELECTRODE REM PT RTRN 9FT ADLT (ELECTROSURGICAL) ×1 IMPLANT
GLOVE BIOGEL PI ORTHO PRO 7.5 (GLOVE) ×1
GLOVE BIOGEL PI ORTHO PRO SZ8 (GLOVE) ×1
GLOVE ORTHO TXT STRL SZ7.5 (GLOVE) ×2 IMPLANT
GLOVE PI ORTHO PRO STRL 7.5 (GLOVE) ×1 IMPLANT
GLOVE PI ORTHO PRO STRL SZ8 (GLOVE) ×1 IMPLANT
GLOVE SURG ORTHO 8.5 STRL (GLOVE) ×2 IMPLANT
GOWN STRL NON-REIN LRG LVL3 (GOWN DISPOSABLE) ×2 IMPLANT
GOWN STRL REIN XL XLG (GOWN DISPOSABLE) ×6 IMPLANT
KIT BASIN OR (CUSTOM PROCEDURE TRAY) ×2 IMPLANT
KIT ROOM TURNOVER OR (KITS) ×2 IMPLANT
MANIFOLD NEPTUNE II (INSTRUMENTS) ×2 IMPLANT
NS IRRIG 1000ML POUR BTL (IV SOLUTION) ×2 IMPLANT
PACK GENERAL/GYN (CUSTOM PROCEDURE TRAY) ×2 IMPLANT
PAD ARMBOARD 7.5X6 YLW CONV (MISCELLANEOUS) ×4 IMPLANT
PAD CAST 4YDX4 CTTN HI CHSV (CAST SUPPLIES) ×1 IMPLANT
PADDING CAST COTTON 4X4 STRL (CAST SUPPLIES) ×1
SPONGE GAUZE 4X4 12PLY (GAUZE/BANDAGES/DRESSINGS) ×2 IMPLANT
STAPLER VISISTAT 35W (STAPLE) ×2 IMPLANT
STOCKINETTE IMPERVIOUS 9X36 MD (GAUZE/BANDAGES/DRESSINGS) IMPLANT
SUT ETHILON 3 0 PS 1 (SUTURE) ×2 IMPLANT
SUT VIC AB 2-0 CT1 27 (SUTURE)
SUT VIC AB 2-0 CT1 TAPERPNT 27 (SUTURE) IMPLANT
TAPE CLOTH SURG 4X10 WHT LF (GAUZE/BANDAGES/DRESSINGS) ×2 IMPLANT
TOWEL OR 17X24 6PK STRL BLUE (TOWEL DISPOSABLE) ×2 IMPLANT
TOWEL OR 17X26 10 PK STRL BLUE (TOWEL DISPOSABLE) ×2 IMPLANT
WATER STERILE IRR 1000ML POUR (IV SOLUTION) ×2 IMPLANT

## 2013-01-09 NOTE — Preoperative (Signed)
Beta Blockers   Reason not to administer Beta Blockers:Not Applicable 

## 2013-01-09 NOTE — Interval H&P Note (Signed)
History and Physical Interval Note:  01/09/2013 12:46 PM  Pamela Bernard  has presented today for surgery, with the diagnosis of LEFT CLAVICLE RETAINED HARDWARE  The various methods of treatment have been discussed with the patient and family. After consideration of risks, benefits and other options for treatment, the patient has consented to  Procedure(s): LEFT CLAVICLE PIN REMOVAL (Left) as a surgical intervention .  The patient's history has been reviewed, patient examined, no change in status, stable for surgery.  I have reviewed the patient's chart and labs.  Questions were answered to the patient's satisfaction.     Quanisha Drewry,STEVEN R

## 2013-01-09 NOTE — Op Note (Signed)
NAMENADIE, FIUMARA NO.:  [id number]  MEDICAL RECORD NO.:  [id number]  LOCATION:  [facility]                         FACILITY:  [facility]  PHYSICIAN:  Almedia Balls. Ranell Patrick, M.D. DATE OF BIRTH:  Jul 04, 1976  DATE OF PROCEDURE:  01/09/2013 DATE OF DISCHARGE:  01/09/2013                              OPERATIVE REPORT   PREOPERATIVE DIAGNOSIS:  Retained hardware, left clavicle.  POSTOPERATIVE DIAGNOSIS:  Retained hardware, left clavicle.  PROCEDURE PERFORMED:  Implant removal deep, left clavicle pin removal.  ATTENDING SURGEON:  Almedia Balls. Ranell Patrick, MD  ASSISTANTS:  None.  ANESTHESIA:  MAC anesthesia plus local was used.  ESTIMATED BLOOD LOSS:  Minimal.  FLUID REPLACED:  500 mL crystalloid.  INSTRUMENT COUNTS:  Correct.  COMPLICATIONS:  There were no complications.  ANTIBIOTICS:  Perioperative antibiotics were given.  INDICATIONS:  The patient is a 37 year old female status post ORIF of the left clavicle.  The patient has retained clavicle pin with some prominence is bothering her.  The patient's fractures healed.  She desires operative removal of her clavicle pin.  Risks and benefits were discussed and the patient signed informed consent.  DESCRIPTION OF PROCEDURE:  After adequate level of anesthesia was achieved, the patient was positioned supine on the operating room table. Left shoulder and hip were bumped up.  Sterile prep and drape was performed.  Time-out called.  Posterior stab incision was created with a 15-blade scalpel.  Blunt dissection down to subcutaneous tissues, clavicle pin identified, medial wrench placed.  The pin was removed without difficulty.  Thoroughly irrigated the wound and closed with interrupted nylon closure.  Sterile compressive bandage was applied.  The patient tolerated the surgery well.     Almedia Balls. Ranell Patrick, M.D.     SRN/MEDQ  D:  01/09/2013  T:  01/09/2013  Job:  [id number]

## 2013-01-09 NOTE — Anesthesia Postprocedure Evaluation (Signed)
Anesthesia Post Note  Patient: Pamela Bernard  Procedure(s) Performed: Procedure(s) (LRB): LEFT CLAVICLE PIN REMOVAL (Left)  Anesthesia type: MAC  Patient location: PACU  Post pain: Pain level controlled and Adequate analgesia  Post assessment: Post-op Vital signs reviewed, Patient's Cardiovascular Status Stable and Respiratory Function Stable  Last Vitals:  Filed Vitals:   01/09/13 1345  BP: 118/83  Pulse: 60  Temp:   Resp: 23    Post vital signs: Reviewed and stable  Level of consciousness: awake, alert  and oriented  Complications: No apparent anesthesia complications

## 2013-01-09 NOTE — Anesthesia Preprocedure Evaluation (Signed)
Anesthesia Evaluation  Patient identified by MRN, date of birth, ID band Patient awake    Reviewed: Allergy & Precautions, H&P , NPO status , Patient's Chart, lab work & pertinent test results  Airway Mallampati: II  Neck ROM: full    Dental   Pulmonary          Cardiovascular     Neuro/Psych    GI/Hepatic   Endo/Other    Renal/GU      Musculoskeletal   Abdominal   Peds  Hematology   Anesthesia Other Findings   Reproductive/Obstetrics                           Anesthesia Physical Anesthesia Plan  ASA: I  Anesthesia Plan: MAC   Post-op Pain Management:    Induction: Intravenous  Airway Management Planned: Simple Face Mask  Additional Equipment:   Intra-op Plan:   Post-operative Plan:   Informed Consent: I have reviewed the patients History and Physical, chart, labs and discussed the procedure including the risks, benefits and alternatives for the proposed anesthesia with the patient or authorized representative who has indicated his/her understanding and acceptance.     Plan Discussed with: CRNA, Anesthesiologist and Surgeon  Anesthesia Plan Comments:         Anesthesia Quick Evaluation

## 2013-01-09 NOTE — Discharge Instructions (Signed)
Ice to the left shoulder Keep incision clean and dry for five days, then ok to shower and get wet. Ok to use the left arm as tolerated  Follow up in two weeks with Thea Gist, PA-C

## 2013-01-09 NOTE — Transfer of Care (Signed)
Immediate Anesthesia Transfer of Care Note  Patient: Pamela Bernard  Procedure(s) Performed: Procedure(s): LEFT CLAVICLE PIN REMOVAL (Left)  Patient Location: PACU  Anesthesia Type:MAC  Level of Consciousness: awake, alert , oriented and patient cooperative  Airway & Oxygen Therapy: Patient Spontanous Breathing  Post-op Assessment: Report given to PACU RN, Post -op Vital signs reviewed and stable and Patient moving all extremities  Post vital signs: Reviewed and stable  Complications: No apparent anesthesia complications

## 2013-01-09 NOTE — Brief Op Note (Signed)
01/09/2013  1:33 PM  PATIENT:  Pamela Bernard  37 y.o. female  PRE-OPERATIVE DIAGNOSIS:  LEFT CLAVICLE RETAINED HARDWARE  POST-OPERATIVE DIAGNOSIS:  LEFT CLAVICLE RETAINED HARDWARE  PROCEDURE:  Procedure(s): LEFT CLAVICLE PIN REMOVAL (Left)  SURGEON:  Surgeon(s) and Role:    * Verlee Rossetti, MD - Primary  PHYSICIAN ASSISTANT:   ASSISTANTS: none   ANESTHESIA:   MAC  EBL:  Total I/O In: 700 [I.V.:700] Out: 5 [Blood:5]  BLOOD ADMINISTERED:none  DRAINS: none   LOCAL MEDICATIONS USED:  MARCAINE     SPECIMEN:  No Specimen  DISPOSITION OF SPECIMEN:  N/A  COUNTS:  YES  TOURNIQUET:  * No tourniquets in log *  DICTATION: .Other Dictation: Dictation Number [id number]  PLAN OF CARE: Discharge to home after PACU  PATIENT DISPOSITION:  PACU - hemodynamically stable.   Delay start of Pharmacological VTE agent (>24hrs) due to surgical blood loss or risk of bleeding: not applicable

## 2013-01-13 ENCOUNTER — Encounter (HOSPITAL_COMMUNITY): Payer: Self-pay | Admitting: Orthopedic Surgery

## 2020-01-21 DIAGNOSIS — Z419 Encounter for procedure for purposes other than remedying health state, unspecified: Secondary | ICD-10-CM | POA: Diagnosis not present

## 2020-02-21 DIAGNOSIS — Z419 Encounter for procedure for purposes other than remedying health state, unspecified: Secondary | ICD-10-CM | POA: Diagnosis not present

## 2020-03-23 DIAGNOSIS — Z419 Encounter for procedure for purposes other than remedying health state, unspecified: Secondary | ICD-10-CM | POA: Diagnosis not present

## 2020-04-22 DIAGNOSIS — Z419 Encounter for procedure for purposes other than remedying health state, unspecified: Secondary | ICD-10-CM | POA: Diagnosis not present

## 2020-05-23 DIAGNOSIS — Z419 Encounter for procedure for purposes other than remedying health state, unspecified: Secondary | ICD-10-CM | POA: Diagnosis not present

## 2020-06-22 DIAGNOSIS — Z419 Encounter for procedure for purposes other than remedying health state, unspecified: Secondary | ICD-10-CM | POA: Diagnosis not present

## 2020-07-23 DIAGNOSIS — Z419 Encounter for procedure for purposes other than remedying health state, unspecified: Secondary | ICD-10-CM | POA: Diagnosis not present

## 2020-08-23 DIAGNOSIS — Z419 Encounter for procedure for purposes other than remedying health state, unspecified: Secondary | ICD-10-CM | POA: Diagnosis not present

## 2020-08-24 DIAGNOSIS — Z113 Encounter for screening for infections with a predominantly sexual mode of transmission: Secondary | ICD-10-CM | POA: Diagnosis not present

## 2020-09-18 DIAGNOSIS — Z20822 Contact with and (suspected) exposure to covid-19: Secondary | ICD-10-CM | POA: Diagnosis not present

## 2020-09-18 DIAGNOSIS — Z03818 Encounter for observation for suspected exposure to other biological agents ruled out: Secondary | ICD-10-CM | POA: Diagnosis not present

## 2020-09-20 DIAGNOSIS — Z419 Encounter for procedure for purposes other than remedying health state, unspecified: Secondary | ICD-10-CM | POA: Diagnosis not present

## 2020-10-21 DIAGNOSIS — Z419 Encounter for procedure for purposes other than remedying health state, unspecified: Secondary | ICD-10-CM | POA: Diagnosis not present

## 2020-11-20 DIAGNOSIS — Z419 Encounter for procedure for purposes other than remedying health state, unspecified: Secondary | ICD-10-CM | POA: Diagnosis not present

## 2020-11-24 ENCOUNTER — Emergency Department (HOSPITAL_COMMUNITY)
Admission: EM | Admit: 2020-11-24 | Discharge: 2020-11-24 | Disposition: A | Discharge status: Home / Self Care | Payer: No Typology Code available for payment source | Attending: Emergency Medicine | Admitting: Emergency Medicine

## 2020-11-24 ENCOUNTER — Emergency Department (HOSPITAL_COMMUNITY): Payer: No Typology Code available for payment source

## 2020-11-24 ENCOUNTER — Other Ambulatory Visit: Payer: Self-pay

## 2020-11-24 DIAGNOSIS — S4992XA Unspecified injury of left shoulder and upper arm, initial encounter: Secondary | ICD-10-CM | POA: Diagnosis present

## 2020-11-24 DIAGNOSIS — S46912A Strain of unspecified muscle, fascia and tendon at shoulder and upper arm level, left arm, initial encounter: Secondary | ICD-10-CM | POA: Diagnosis not present

## 2020-11-24 DIAGNOSIS — Y990 Civilian activity done for income or pay: Secondary | ICD-10-CM | POA: Insufficient documentation

## 2020-11-24 DIAGNOSIS — W010XXA Fall on same level from slipping, tripping and stumbling without subsequent striking against object, initial encounter: Secondary | ICD-10-CM | POA: Diagnosis not present

## 2020-11-24 DIAGNOSIS — M25512 Pain in left shoulder: Secondary | ICD-10-CM | POA: Diagnosis not present

## 2020-11-24 MED ORDER — CYCLOBENZAPRINE HCL 10 MG PO TABS
10.0000 mg | ORAL_TABLET | Freq: Two times a day (BID) | ORAL | 0 refills | Status: AC | PRN
Start: 2020-11-24 — End: ?

## 2020-11-24 NOTE — Discharge Instructions (Signed)
Recommend following up with your orthopedic doctor.  Take Tylenol Motrin as needed for pain control.  Take prescribed muscle relaxer as needed as well.  Note this can make you slightly drowsy should not be taken while driving or operating heavy machinery.  Recommend rest, ice, elevate affected extremity, bear weight as tolerated.

## 2020-11-24 NOTE — ED Triage Notes (Signed)
Pt reports mechanical fall this morning at work, slipped and fell onto concrete. Caught herself with her L arm, and has L shoulder pain. Hx of broken clavicle on that side. Reports when she fell some chemicals like Drain-O got on her, but she went home and took a shower prior to coming to the ED. Denies LOC.

## 2020-11-24 NOTE — ED Provider Notes (Addendum)
[facility] EMERGENCY DEPARTMENT Provider Note   CSN: [id number] Arrival date & time: 11/24/20  4098     History No chief complaint on file.   Pamela Bernard is a 45 y.o. female.  Presented to ER with concern for fall.  Patient states that she slipped and fell at work landing on her left shoulder.  Is having pain over her left shoulder.  Pain is worse with movement, improved with rest, currently moderate.  No numbness tingling or weakness.  Reports history of prior collarbone fracture.  No head trauma or loss of consciousness.  HPI     Past Medical History:  Diagnosis Date  . Anemia    hx  . No pertinent past medical history     There are no problems to display for this patient.   Past Surgical History:  Procedure Laterality Date  . HARDWARE REMOVAL Left 01/09/2013   Procedure: LEFT CLAVICLE PIN REMOVAL;  Surgeon: Verlee Rossetti, MD;  Location: [facility] OR;  Service: Orthopedics;  Laterality: Left;  . NO PAST SURGERIES    . ORIF CLAVICULAR FRACTURE  05/09/2012   Procedure: OPEN REDUCTION INTERNAL FIXATION (ORIF) CLAVICULAR FRACTURE;  Surgeon: Verlee Rossetti, MD;  Location: [facility] OR;  Service: Orthopedics;  Laterality: Left;  clavicle pinning     OB History   No obstetric history on file.     No family history on file.  Social History   Tobacco Use  . Smoking status: Never Smoker  Substance Use Topics  . Alcohol use: No  . Drug use: No    Home Medications Prior to Admission medications   Medication Sig Start Date End Date Taking? Authorizing Provider  cyclobenzaprine (FLEXERIL) 10 MG tablet Take 1 tablet (10 mg total) by mouth 2 (two) times daily as needed for muscle spasms. 11/24/20  Yes Milagros Loll, MD  oxyCODONE-acetaminophen (ROXICET) 5-325 MG per tablet Take 1-2 tablets by mouth every 4 (four) hours as needed for pain. 01/09/13   Beverely Low, MD    Allergies    Patient has no known allergies.  Review of Systems   Review of Systems   Musculoskeletal: Positive for arthralgias.  All other systems reviewed and are negative.   Physical Exam Updated Vital Signs BP 118/85 (BP Location: Right Arm)   Pulse (!) 111   Temp 98.7 F (37.1 C)   Resp 14   SpO2 100%   Physical Exam Vitals and nursing note reviewed.  Constitutional:      General: She is not in acute distress.    Appearance: She is well-developed.  HENT:     Head: Normocephalic and atraumatic.  Eyes:     Conjunctiva/sclera: Conjunctivae normal.  Neck:     Comments: No midline C-spine tenderness Cardiovascular:     Rate and Rhythm: Normal rate.     Pulses: Normal pulses.  Pulmonary:     Effort: Pulmonary effort is normal. No respiratory distress.  Musculoskeletal:     Cervical back: Neck supple.     Comments: LUE: Some tenderness over the shoulder, normal joint range of motion throughout, no deformity, normal distal pulses, normal sensation  Skin:    General: Skin is warm and dry.  Neurological:     Mental Status: She is alert.  Psychiatric:        Mood and Affect: Mood normal.     ED Results / Procedures / Treatments   Labs (all labs ordered are listed, but only abnormal results are displayed) Labs  Reviewed - No data to display  EKG None  Radiology DG Shoulder Left  Result Date: 11/24/2020 CLINICAL DATA:  Pain following fall EXAM: LEFT SHOULDER - 2+ VIEW COMPARISON:  None. FINDINGS: Oblique, Y scapular, and axillary images obtained. No acute fracture or dislocation. There is slight narrowing of the acromioclavicular joint. The glenohumeral joint appears normal. A tiny calcification immediately lateral to the inferior glenoid potentially could represent residua of prior trauma. No erosive change. Visualized left lung clear. IMPRESSION: No acute fracture or dislocation. Tiny calcification lateral to the inferior glenoid may represent residua of previous trauma. Mild narrowing acromioclavicular joint. Glenohumeral joint appears unremarkable.  Electronically Signed   By: Bretta Bang III M.D.   On: 11/24/2020 09:17    Procedures Procedures   Medications Ordered in ED Medications - No data to display  ED Course  I have reviewed the triage vital signs and the nursing notes.  Pertinent labs & imaging results that were available during my care of the patient were reviewed by me and considered in my medical decision making (see chart for details).    MDM Rules/Calculators/A&P                         46 year old lady presented to ER with concern for left shoulder pain after fall.  She is neurovascularly intact.  Well-appearing, no significant trauma identified on exam but did have some tenderness to palpation over the shoulder.  Plain films are negative.  Discharged home, recommend follow-up with her orthopedist as needed if symptoms not resolving.  After the discussed management above, the patient was determined to be safe for discharge.  The patient was in agreement with this plan and all questions regarding their care were answered.  ED return precautions were discussed and the patient will return to the ED with any significant worsening of condition.    Final Clinical Impression(s) / ED Diagnoses Final diagnoses:  Strain of left shoulder, initial encounter    Rx / DC Orders ED Discharge Orders         Ordered    cyclobenzaprine (FLEXERIL) 10 MG tablet  2 times daily PRN        11/24/20 0940           Milagros Loll, MD 11/24/20 1000    Milagros Loll, MD 11/24/20 1000

## 2020-12-21 DIAGNOSIS — Z419 Encounter for procedure for purposes other than remedying health state, unspecified: Secondary | ICD-10-CM | POA: Diagnosis not present

## 2021-01-20 DIAGNOSIS — Z419 Encounter for procedure for purposes other than remedying health state, unspecified: Secondary | ICD-10-CM | POA: Diagnosis not present

## 2021-02-20 DIAGNOSIS — Z419 Encounter for procedure for purposes other than remedying health state, unspecified: Secondary | ICD-10-CM | POA: Diagnosis not present

## 2021-03-23 DIAGNOSIS — Z419 Encounter for procedure for purposes other than remedying health state, unspecified: Secondary | ICD-10-CM | POA: Diagnosis not present

## 2021-04-22 DIAGNOSIS — Z419 Encounter for procedure for purposes other than remedying health state, unspecified: Secondary | ICD-10-CM | POA: Diagnosis not present

## 2021-05-23 DIAGNOSIS — Z419 Encounter for procedure for purposes other than remedying health state, unspecified: Secondary | ICD-10-CM | POA: Diagnosis not present

## 2021-06-14 ENCOUNTER — Encounter: Payer: Self-pay | Admitting: Family Medicine

## 2021-06-21 IMAGING — CR DG SHOULDER 2+V*L*
3 series · 3 of 3 positions shown · non-contrast
Comparison: None.

CLINICAL DATA: Pain following fall

EXAM:
LEFT SHOULDER - 2+ VIEW

[shoulder grashey]
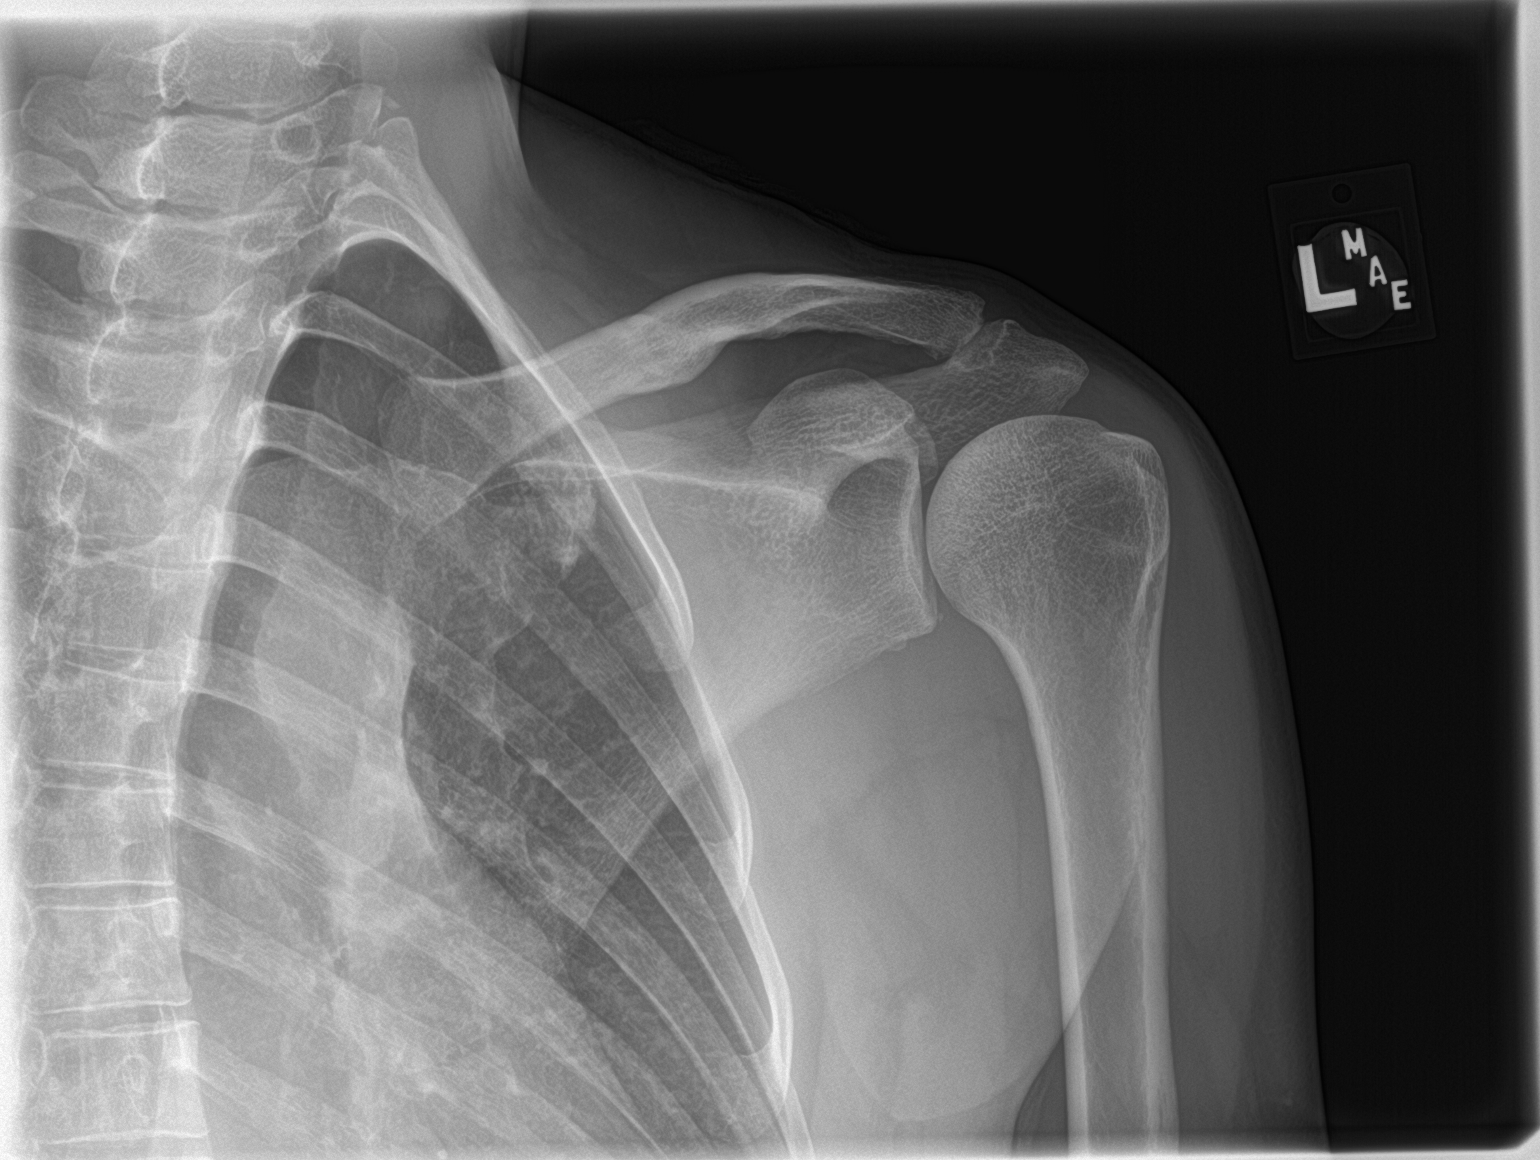

[shoulder y view]
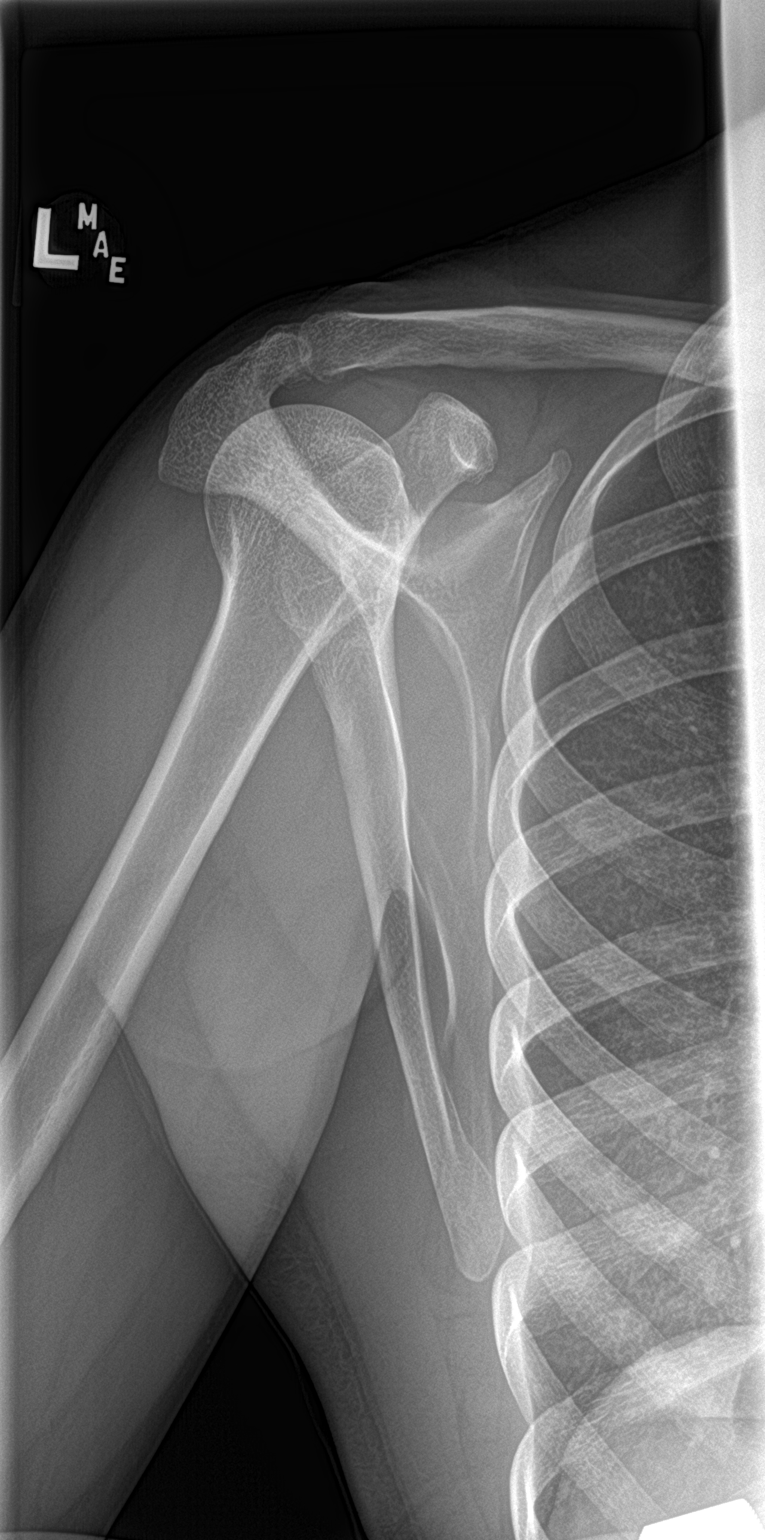

[shoulder axillary]
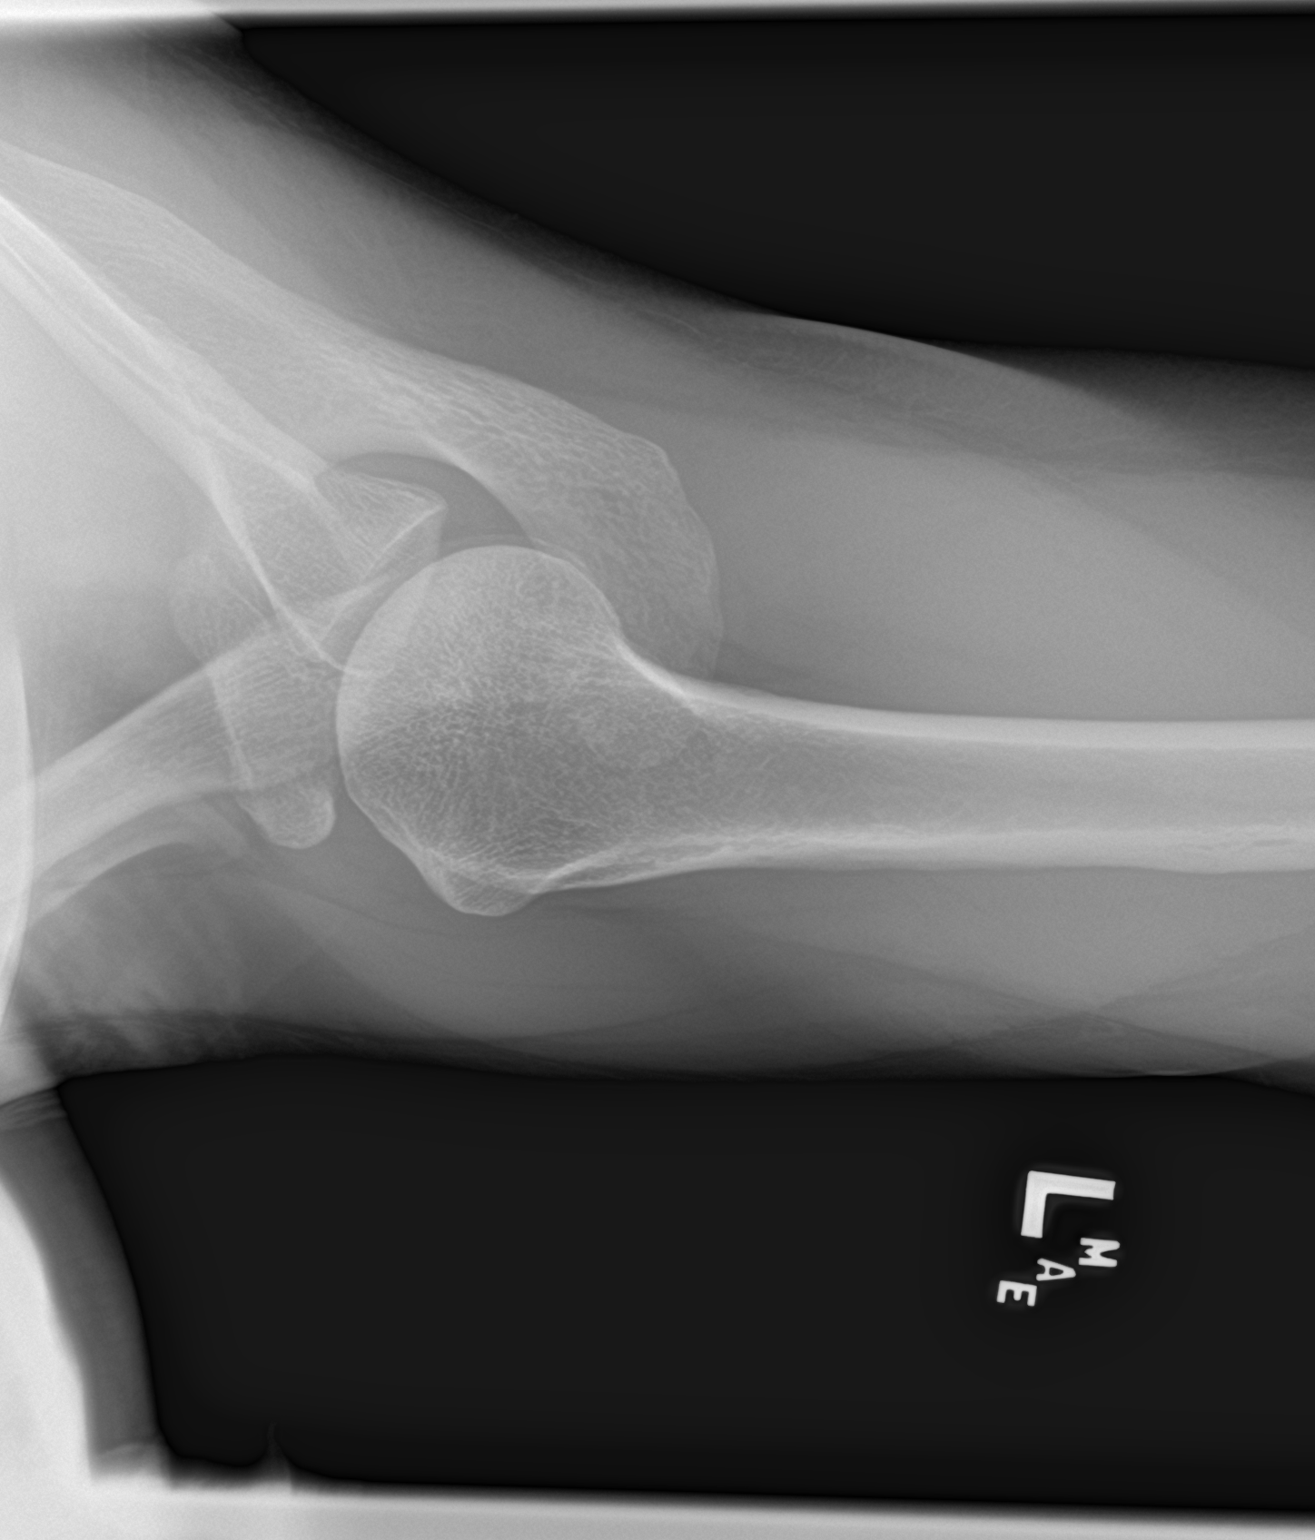

[3 of 3 positions shown; findings below may reference images not displayed]

FINDINGS: Oblique, Y scapular, and axillary images obtained. No acute fracture
or dislocation. There is slight narrowing of the acromioclavicular
joint. The glenohumeral joint appears normal. A tiny calcification
immediately lateral to the inferior glenoid potentially could
represent residua of prior trauma. No erosive change. Visualized
left lung clear.
IMPRESSION: No acute fracture or dislocation. Tiny calcification lateral to the
inferior glenoid may represent residua of previous trauma. Mild
narrowing acromioclavicular joint. Glenohumeral joint appears
unremarkable.

## 2021-06-22 DIAGNOSIS — Z419 Encounter for procedure for purposes other than remedying health state, unspecified: Secondary | ICD-10-CM | POA: Diagnosis not present

## 2021-07-12 ENCOUNTER — Ambulatory Visit (INDEPENDENT_AMBULATORY_CARE_PROVIDER_SITE_OTHER): Payer: BC Managed Care – PPO

## 2021-07-12 ENCOUNTER — Ambulatory Visit (HOSPITAL_COMMUNITY)
Admission: EM | Admit: 2021-07-12 | Discharge: 2021-07-12 | Disposition: A | Discharge status: Home / Self Care | Payer: BC Managed Care – PPO | Attending: Internal Medicine | Admitting: Internal Medicine

## 2021-07-12 ENCOUNTER — Encounter (HOSPITAL_COMMUNITY): Payer: Self-pay | Admitting: Emergency Medicine

## 2021-07-12 ENCOUNTER — Other Ambulatory Visit: Payer: Self-pay

## 2021-07-12 DIAGNOSIS — M545 Low back pain, unspecified: Secondary | ICD-10-CM

## 2021-07-12 DIAGNOSIS — M5441 Lumbago with sciatica, right side: Secondary | ICD-10-CM | POA: Diagnosis not present

## 2021-07-12 DIAGNOSIS — G8929 Other chronic pain: Secondary | ICD-10-CM | POA: Diagnosis not present

## 2021-07-12 MED ORDER — KETOROLAC TROMETHAMINE 60 MG/2ML IM SOLN
60.0000 mg | Freq: Once | INTRAMUSCULAR | Status: AC
  Administered 2021-07-12: 10:00:00 60 mg via INTRAMUSCULAR

## 2021-07-12 MED ORDER — METHYLPREDNISOLONE ACETATE 80 MG/ML IJ SUSP
INTRAMUSCULAR | Status: AC
  Filled 2021-07-12: qty 1

## 2021-07-12 MED ORDER — METHYLPREDNISOLONE ACETATE 80 MG/ML IJ SUSP
80.0000 mg | Freq: Once | INTRAMUSCULAR | Status: AC
  Administered 2021-07-12: 10:00:00 80 mg via INTRAMUSCULAR

## 2021-07-12 MED ORDER — KETOROLAC TROMETHAMINE 60 MG/2ML IM SOLN
INTRAMUSCULAR | Status: AC
  Filled 2021-07-12: qty 2

## 2021-07-12 NOTE — ED Provider Notes (Signed)
[facility]    CSN: [id number] Arrival date & time: 07/12/21  0539      History   Chief Complaint Chief Complaint  Patient presents with   Back Pain    HPI Pamela Bernard is a 45 y.o. female.   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   Past Surgical History:  Procedure Laterality Date   HARDWARE REMOVAL Left 01/09/2013   Procedure: LEFT CLAVICLE PIN REMOVAL;  Surgeon: Verlee Rossetti, MD;  Location: [facility] OR;  Service: Orthopedics;  Laterality: Left;   NO PAST SURGERIES     ORIF CLAVICULAR FRACTURE  05/09/2012   Procedure: OPEN REDUCTION INTERNAL FIXATION (ORIF) CLAVICULAR FRACTURE;  Surgeon: Verlee Rossetti, MD;  Location: [facility] OR;  Service: Orthopedics;  Laterality: Left;  clavicle pinning    OB History   No obstetric history on file.      Home Medications    Prior to Admission medications   Medication Sig Start Date End Date Taking? Authorizing Provider  cyclobenzaprine (FLEXERIL) 10 MG tablet Take 1 tablet (10 mg total) by mouth 2 (two) times daily as needed for muscle spasms.  11/24/20   Milagros Loll, MD  oxyCODONE-acetaminophen (ROXICET) 5-325 MG per tablet Take 1-2 tablets by mouth every 4 (four) hours as needed for pain. 01/09/13   Beverely Low, MD    Family History No family history on file.  Social History Social History   Tobacco Use   Smoking status: Never  Substance Use Topics   Alcohol use: No   Drug use: No     Allergies   Patient has no known allergies.   Review of Systems Review of Systems  All other systems reviewed and are negative. Per HPI  Physical Exam Triage Vital Signs ED Triage Vitals  Enc Vitals Group     BP 07/12/21 0853 (!) 108/56     Pulse Rate 07/12/21 0853 85     Resp 07/12/21 0853 16     Temp 07/12/21 0853 98.7 F (37.1 C)     Temp Source 07/12/21 0853 Oral     SpO2 07/12/21 0853 99 %     Weight --      Height --      Head Circumference --      Peak Flow --      Pain Score 07/12/21 0850 9     Pain Loc --      Pain Edu? --      Excl. in GC? --  No data found.  Updated Vital Signs BP (!) 108/56 (BP Location: Right Arm)    Pulse 85    Temp 98.7 F (37.1 C) (Oral)    Resp 16    LMP 07/06/2021    SpO2 99%   Visual Acuity Right Eye Distance:   Left Eye Distance:   Bilateral Distance:    Right Eye Near:   Left Eye Near:    Bilateral Near:     Physical Exam Constitutional:      General: She is not in acute distress.    Appearance: Normal appearance. She is not ill-appearing or toxic-appearing.  HENT:     Head: Normocephalic and atraumatic.  Cardiovascular:     Rate and Rhythm: Normal rate.  Pulmonary:     Effort: Pulmonary effort is normal.  Musculoskeletal:     Comments: Lumbar spine: - Inspection: no gross deformity or asymmetry, swelling or ecchymosis - Palpation: She has tenderness palpation in the region of her right paraspinal musculature and into her right buttocks, no TTP over the spinous processes or SI joints b/l - ROM: full active ROM of the lumbar spine in flexion and extension  with pain worse with extension - Strength: 5/5 strength of lower extremity in L4-S1 nerve root distributions b/l; normal gait - Neuro: sensation intact in the L4-S1 nerve root distribution b/l, 2+ L4 and S1 reflexes - Special testing: Negative slump  Hips: Full range of motion in internal and external rotation without significant pain    Skin:    General: Skin is warm and dry.     Capillary Refill: Capillary refill takes less than 2 seconds.  Neurological:     General: No focal deficit present.     Mental Status: She is alert and oriented to person, place, and time.     UC Treatments / Results  Labs (all labs ordered are listed, but only abnormal results are displayed) Labs Reviewed - No data to display  EKG   Radiology DG Lumbar Spine 2-3 Views  Result Date: 07/12/2021 CLINICAL DATA:  right back pain with sciatica after fall EXAM: LUMBAR SPINE - 2-3 VIEW COMPARISON:  None. FINDINGS: Vertebral body heights are maintained. No evidence of acute fracture. No substantial sagittal subluxation. Lower lumbar facet arthropathy with potential foraminal stenosis. IMPRESSION: 1. No radiographic evidence of acute fracture or traumatic malalignment. Cross-sectional imaging could provide more sensitive evaluation if clinically indicated. 2. Lower lumbar facet arthropathy with potential foraminal stenosis. An MRI of the lumbar spine could better evaluate the canal and foramina clinically indicated. Electronically Signed   By: Feliberto Harts M.D.   On: 07/12/2021 09:31    Procedures Procedures (including critical care time)  Medications Ordered in UC Medications  ketorolac (TORADOL) injection 60 mg (has no administration in time range)  methylPREDNISolone acetate (DEPO-MEDROL) injection 80 mg (has no administration in time range)    Initial Impression / Assessment and Plan / UC Course  I have reviewed the triage vital signs and the nursing notes.  Pertinent labs & imaging results that  were available during my care of the patient were reviewed by me and considered in my medical decision making (see chart for details).     X-rays are negative for acute fracture.  She does have some facet arthropathy and possible foraminal stenosis noted on x-ray.  Recommend follow-up with sports medicine over the next few weeks for consideration of MRI in the future if she is not improving.  No red flag symptoms on history  or examination.  Discussed prednisone Dosepak versus IM Depo-Medrol and IM Toradol and she opts to proceed with IM injections.  She was given an IM Toradol 60 mg IM Depo-Medrol 80 mg injection today.  Given ED precautions, see AVS.  Final Clinical Impressions(s) / UC Diagnoses   Final diagnoses:  Chronic right-sided low back pain with right-sided sciatica     Discharge Instructions      For your back pain we gave you 2 injections today 1 is Depo-Medrol which is a steroid and the other is Toradol which is an anti-inflammatory.  These should help with your pain and inflammation.  Your x-ray did not show any fractures, it does appear you are starting to get some arthritis in the facet joints at the back of the spine and this could possibly pinched on a nerve.  It is important that you follow-up with the sports medicine office or an orthopedic doctor of your choice over the next few weeks to ensure that you are improving and that further imaging can be performed if needed.  Go to the emergency room right away if you have numbness and tingling in your groin, difficulty urinating, leg weakness.     ED Prescriptions   None    PDMP not reviewed this encounter.   Unknown Jim, DO 07/12/21 [phone]

## 2021-07-12 NOTE — Discharge Instructions (Signed)
For your back pain we gave you 2 injections today 1 is Depo-Medrol which is a steroid and the other is Toradol which is an anti-inflammatory.  These should help with your pain and inflammation.  Your x-ray did not show any fractures, it does appear you are starting to get some arthritis in the facet joints at the back of the spine and this could possibly pinched on a nerve.  It is important that you follow-up with the sports medicine office or an orthopedic doctor of your choice over the next few weeks to ensure that you are improving and that further imaging can be performed if needed.  Go to the emergency room right away if you have numbness and tingling in your groin, difficulty urinating, leg weakness.

## 2021-07-12 NOTE — ED Triage Notes (Signed)
Pt reports having sciatic pains for 2 days. 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 Reports been bad and radiating down right leg over the past couple days.

## 2021-07-23 DIAGNOSIS — Z419 Encounter for procedure for purposes other than remedying health state, unspecified: Secondary | ICD-10-CM | POA: Diagnosis not present

## 2021-07-26 ENCOUNTER — Ambulatory Visit: Payer: BC Managed Care – PPO | Admitting: Family Medicine

## 2021-07-27 ENCOUNTER — Ambulatory Visit: Payer: BC Managed Care – PPO | Admitting: Sports Medicine

## 2021-07-31 ENCOUNTER — Ambulatory Visit: Payer: BC Managed Care – PPO | Admitting: Family Medicine

## 2021-08-23 DIAGNOSIS — Z419 Encounter for procedure for purposes other than remedying health state, unspecified: Secondary | ICD-10-CM | POA: Diagnosis not present

## 2021-09-20 DIAGNOSIS — Z419 Encounter for procedure for purposes other than remedying health state, unspecified: Secondary | ICD-10-CM | POA: Diagnosis not present

## 2021-10-21 DIAGNOSIS — Z419 Encounter for procedure for purposes other than remedying health state, unspecified: Secondary | ICD-10-CM | POA: Diagnosis not present

## 2021-11-20 DIAGNOSIS — Z419 Encounter for procedure for purposes other than remedying health state, unspecified: Secondary | ICD-10-CM | POA: Diagnosis not present

## 2021-12-21 DIAGNOSIS — Z419 Encounter for procedure for purposes other than remedying health state, unspecified: Secondary | ICD-10-CM | POA: Diagnosis not present

## 2022-01-20 DIAGNOSIS — Z419 Encounter for procedure for purposes other than remedying health state, unspecified: Secondary | ICD-10-CM | POA: Diagnosis not present

## 2022-02-06 IMAGING — DX DG LUMBAR SPINE 2-3V
2 series · 2 of 2 positions shown · non-contrast
Comparison: None.

CLINICAL DATA: right back pain with sciatica after fall

EXAM:
LUMBAR SPINE - 2-3 VIEW

[l-spine ap]
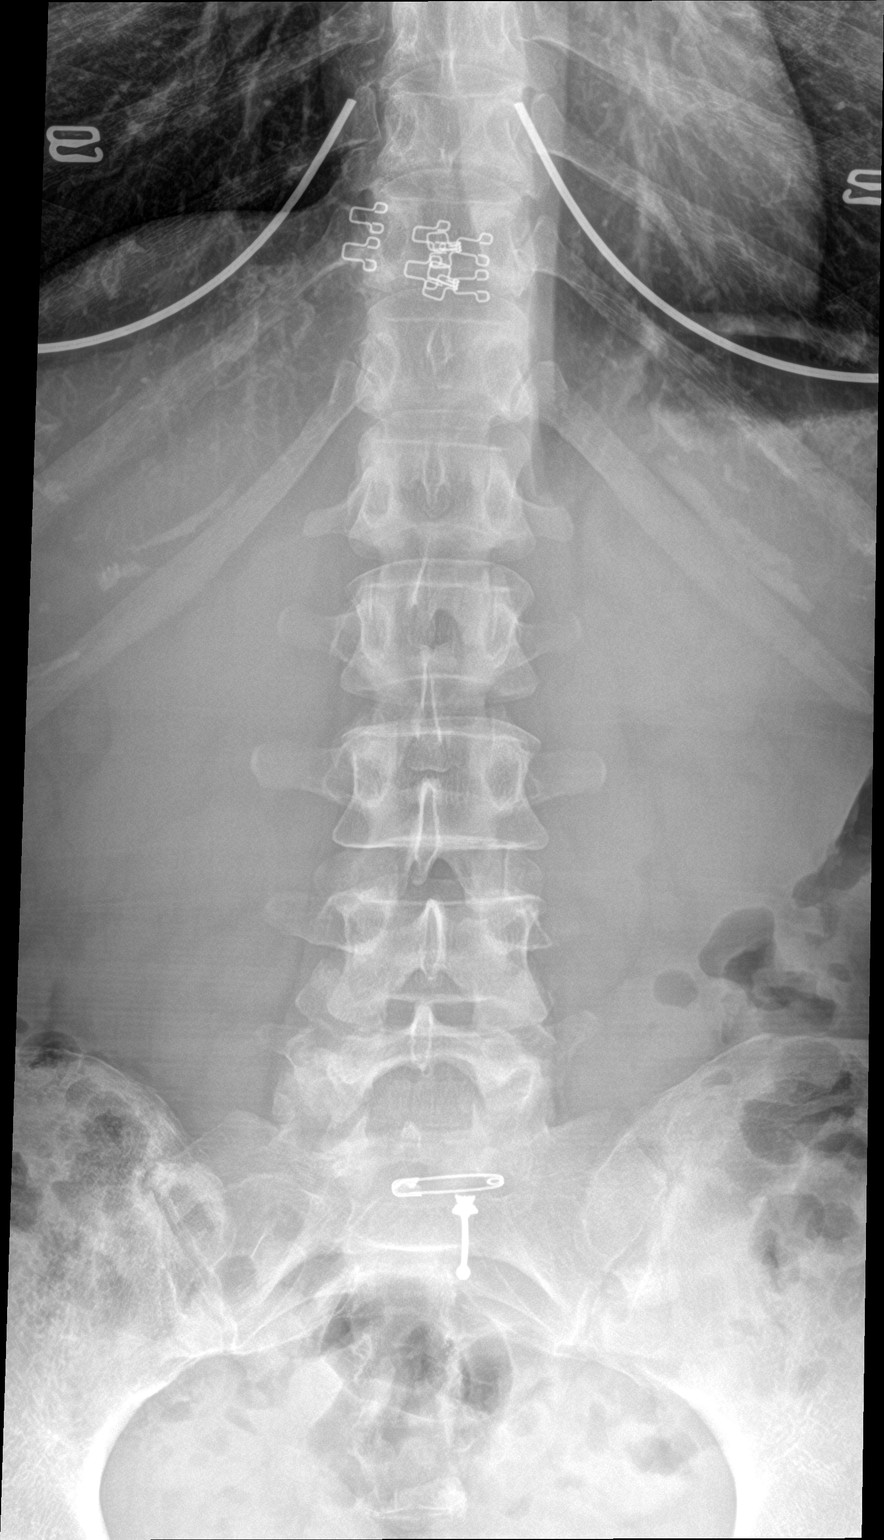

[l-spine lat]
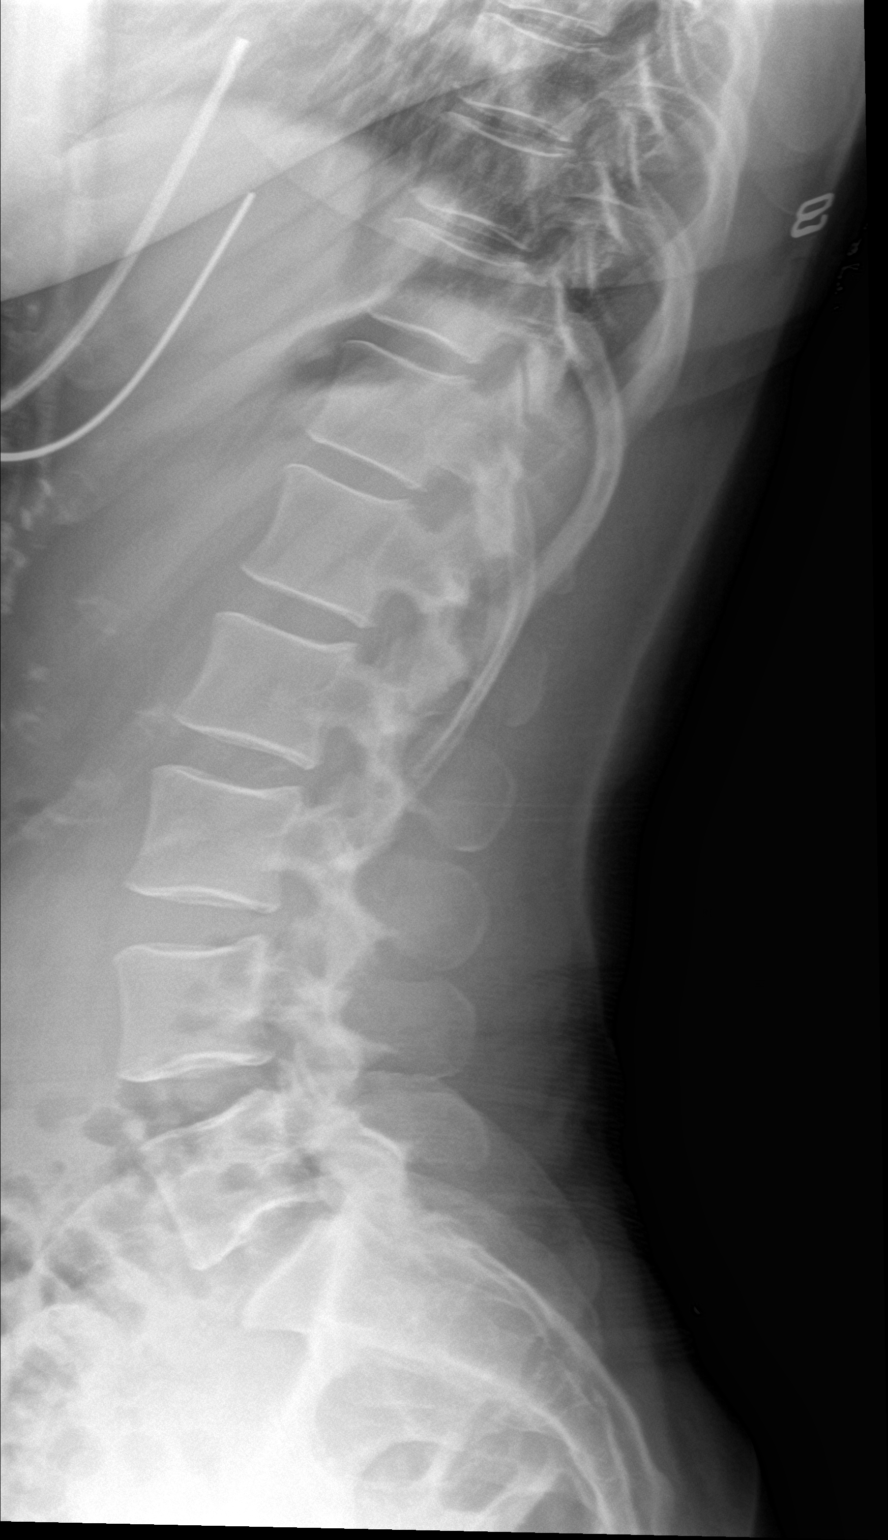

[2 of 2 positions shown; findings below may reference images not displayed]

FINDINGS: Vertebral body heights are maintained. No evidence of acute
fracture. No substantial sagittal subluxation. Lower lumbar facet
arthropathy with potential foraminal stenosis.
IMPRESSION: 1. No radiographic evidence of acute fracture or traumatic
malalignment. Cross-sectional imaging could provide more sensitive
evaluation if clinically indicated.
2. Lower lumbar facet arthropathy with potential foraminal stenosis.
An MRI of the lumbar spine could better evaluate the canal and
foramina clinically indicated.

## 2022-02-20 DIAGNOSIS — Z419 Encounter for procedure for purposes other than remedying health state, unspecified: Secondary | ICD-10-CM | POA: Diagnosis not present

## 2022-03-23 DIAGNOSIS — Z419 Encounter for procedure for purposes other than remedying health state, unspecified: Secondary | ICD-10-CM | POA: Diagnosis not present

## 2022-04-22 DIAGNOSIS — Z419 Encounter for procedure for purposes other than remedying health state, unspecified: Secondary | ICD-10-CM | POA: Diagnosis not present

## 2022-05-23 DIAGNOSIS — Z419 Encounter for procedure for purposes other than remedying health state, unspecified: Secondary | ICD-10-CM | POA: Diagnosis not present

## 2022-06-22 DIAGNOSIS — Z419 Encounter for procedure for purposes other than remedying health state, unspecified: Secondary | ICD-10-CM | POA: Diagnosis not present

## 2022-07-23 DIAGNOSIS — Z419 Encounter for procedure for purposes other than remedying health state, unspecified: Secondary | ICD-10-CM | POA: Diagnosis not present

## 2022-07-30 DIAGNOSIS — Z3009 Encounter for other general counseling and advice on contraception: Secondary | ICD-10-CM | POA: Diagnosis not present

## 2022-07-30 DIAGNOSIS — Z114 Encounter for screening for human immunodeficiency virus [HIV]: Secondary | ICD-10-CM | POA: Diagnosis not present

## 2022-07-30 DIAGNOSIS — Z113 Encounter for screening for infections with a predominantly sexual mode of transmission: Secondary | ICD-10-CM | POA: Diagnosis not present

## 2022-07-30 DIAGNOSIS — Z131 Encounter for screening for diabetes mellitus: Secondary | ICD-10-CM | POA: Diagnosis not present

## 2022-08-02 DIAGNOSIS — A5901 Trichomonal vulvovaginitis: Secondary | ICD-10-CM | POA: Diagnosis not present

## 2022-08-16 DIAGNOSIS — F849 Pervasive developmental disorder, unspecified: Secondary | ICD-10-CM | POA: Diagnosis not present

## 2022-08-22 DIAGNOSIS — Z30430 Encounter for insertion of intrauterine contraceptive device: Secondary | ICD-10-CM | POA: Diagnosis not present

## 2022-08-22 DIAGNOSIS — Z3202 Encounter for pregnancy test, result negative: Secondary | ICD-10-CM | POA: Diagnosis not present

## 2022-08-23 DIAGNOSIS — Z419 Encounter for procedure for purposes other than remedying health state, unspecified: Secondary | ICD-10-CM | POA: Diagnosis not present

## 2022-09-21 DIAGNOSIS — Z419 Encounter for procedure for purposes other than remedying health state, unspecified: Secondary | ICD-10-CM | POA: Diagnosis not present

## 2022-10-22 DIAGNOSIS — Z419 Encounter for procedure for purposes other than remedying health state, unspecified: Secondary | ICD-10-CM | POA: Diagnosis not present

## 2022-10-30 DIAGNOSIS — Z30431 Encounter for routine checking of intrauterine contraceptive device: Secondary | ICD-10-CM | POA: Diagnosis not present

## 2022-10-30 DIAGNOSIS — Z114 Encounter for screening for human immunodeficiency virus [HIV]: Secondary | ICD-10-CM | POA: Diagnosis not present

## 2022-10-30 DIAGNOSIS — Z113 Encounter for screening for infections with a predominantly sexual mode of transmission: Secondary | ICD-10-CM | POA: Diagnosis not present

## 2022-10-30 DIAGNOSIS — Z124 Encounter for screening for malignant neoplasm of cervix: Secondary | ICD-10-CM | POA: Diagnosis not present

## 2022-11-21 DIAGNOSIS — Z419 Encounter for procedure for purposes other than remedying health state, unspecified: Secondary | ICD-10-CM | POA: Diagnosis not present

## 2022-12-22 DIAGNOSIS — Z419 Encounter for procedure for purposes other than remedying health state, unspecified: Secondary | ICD-10-CM | POA: Diagnosis not present

## 2023-01-21 DIAGNOSIS — Z419 Encounter for procedure for purposes other than remedying health state, unspecified: Secondary | ICD-10-CM | POA: Diagnosis not present

## 2023-02-21 DIAGNOSIS — Z419 Encounter for procedure for purposes other than remedying health state, unspecified: Secondary | ICD-10-CM | POA: Diagnosis not present

## 2023-03-24 DIAGNOSIS — Z419 Encounter for procedure for purposes other than remedying health state, unspecified: Secondary | ICD-10-CM | POA: Diagnosis not present

## 2023-04-23 DIAGNOSIS — Z419 Encounter for procedure for purposes other than remedying health state, unspecified: Secondary | ICD-10-CM | POA: Diagnosis not present

## 2023-06-23 DIAGNOSIS — Z419 Encounter for procedure for purposes other than remedying health state, unspecified: Secondary | ICD-10-CM | POA: Diagnosis not present

## 2024-03-31 ENCOUNTER — Other Ambulatory Visit: Payer: Self-pay | Admitting: Nephrology

## 2024-03-31 DIAGNOSIS — Z1231 Encounter for screening mammogram for malignant neoplasm of breast: Secondary | ICD-10-CM

## 2024-04-01 ENCOUNTER — Ambulatory Visit

## 2024-04-07 ENCOUNTER — Ambulatory Visit

## 2024-04-15 ENCOUNTER — Ambulatory Visit

## 2024-04-22 ENCOUNTER — Ambulatory Visit
Admission: RE | Admit: 2024-04-22 | Discharge: 2024-04-22 | Disposition: A | Discharge status: Home / Self Care | Source: Ambulatory Visit | Attending: Nephrology | Admitting: Nephrology

## 2024-04-22 DIAGNOSIS — Z1231 Encounter for screening mammogram for malignant neoplasm of breast: Secondary | ICD-10-CM

## 2024-05-06 ENCOUNTER — Other Ambulatory Visit: Payer: Self-pay

## 2024-05-06 DIAGNOSIS — Z87898 Personal history of other specified conditions: Secondary | ICD-10-CM

## 2024-05-14 ENCOUNTER — Ambulatory Visit: Admitting: *Deleted

## 2024-05-14 VITALS — BP 106/70 | Wt 172.7 lb

## 2024-05-14 DIAGNOSIS — N6325 Unspecified lump in the left breast, overlapping quadrants: Secondary | ICD-10-CM

## 2024-05-14 DIAGNOSIS — Z1239 Encounter for other screening for malignant neoplasm of breast: Secondary | ICD-10-CM

## 2024-05-14 NOTE — Progress Notes (Signed)
 [name] is a 48 y.o. female who presents to [facility] clinic today with no complaints. Patient referred to [facility] due to having a screening mammogram completed 04/27/2024 that additional imaging of both breasts is recommended for follow up.   Pap Smear: Pap smear not completed today. Last Pap smear was 11/19/2014 at the [facility] clinic and was normal with Trichomonas. Per patient has no history of an abnormal Pap smear. Last Pap smear result is available in Epic.   Physical exam: Breasts Breasts symmetrical. No skin abnormalities bilateral breasts. No nipple retraction bilateral breasts. No nipple discharge bilateral breasts. No lymphadenopathy. No lumps palpated bilateral breasts right breast. Palpated a mobile lump within the left breast at 12 o'clock 1 cm from the nipple. No complaints of pain or tenderness on exam.  MM 3D SCREENING MAMMOGRAM BILATERAL BREAST Result Date: 04/27/2024 CLINICAL DATA:  Screening. EXAM: DIGITAL SCREENING BILATERAL MAMMOGRAM WITH TOMOSYNTHESIS AND CAD TECHNIQUE: Bilateral screening digital craniocaudal and mediolateral oblique mammograms were obtained. Bilateral screening digital breast tomosynthesis was performed. The images were evaluated with computer-aided detection. COMPARISON:  None available. ACR Breast Density Category d: The breasts are extremely dense, which lowers the sensitivity of mammography. FINDINGS: In the right breast a possible mass about the upper-outer breast, posterior third, requires further evaluation. In the left breast 2 possible masses about the upper central breast, posterior third requires further evaluation. Indeterminate calcifications about the inner breast, middle third, also require further evaluation. IMPRESSION: Further evaluation is suggested for a possible mass in the right breast. Further evaluation is suggested for 2 possible masses and indeterminate calcifications in the left breast. RECOMMENDATION:  Diagnostic mammogram and possibly ultrasound of both breasts. (Code:[id number]) The patient will be contacted regarding the findings, and additional imaging will be scheduled. BI-RADS CATEGORY  0: Incomplete: Need additional imaging evaluation. Electronically Signed   By: Curtistine Noble   On: 04/27/2024 08:32     Pelvic/Bimanual Pap smear is due. Patient refused Pap smear today and stated she will call to reschedule. Explained the importance of having Pap smear completed.   Smoking History: Patient currently vapes. Discussed smoking cessation with patient. Referred to the [facility] Quitline.   Patient Navigation: Patient education provided. Access to services provided for patient through [facility] program.   Colorectal Cancer Screening: Per patient has never had colonoscopy completed. Patient completed a FIT Test 03/30/2024 and negative. No complaints today.    Breast and Cervical Cancer Risk Assessment: Patient does not have family history of breast cancer, known genetic mutations, or radiation treatment to the chest before age 29. Patient does not have history of cervical dysplasia, immunocompromised, or DES exposure in-utero.  Risk Scores as of Encounter on 05/14/2024     Pamela Bernard           5-year 0.72%   Lifetime 7.1%            Last calculated by Pamela Bernard BRAVO, CMA on 05/14/2024 at  2:54 PM        A: [facility] exam without pap smear No complaints.  P: Referred patient to the [facility] for a diagnostic mammogram per recommendation. Appointment scheduled Thursday, May 21, 2024 at 1010.  Driscilla Wanda SQUIBB, RN 05/14/2024 3:22 PM   Driscilla Wanda SQUIBB, RN 05/14/2024 10:40 AM

## 2024-05-14 NOTE — Patient Instructions (Signed)
 Explained breast self awareness with Harmani Lim. Pap smear is due. Patient refused Pap smear today and stated she will call to reschedule. Explained the importance of having Pap smear completed. Let her know [facility] will cover Pap smears every 3 years unless has a history of abnormal Pap smears. Referred patient to the [facility] for a diagnostic mammogram per recommendation. Appointment scheduled Thursday, May 21, 2024 at 1010. Patient aware of appointment and will be there. Petra Bothun verbalized understanding.  Dontel Harshberger, Wanda Ship, RN 3:20 PM

## 2024-05-21 ENCOUNTER — Ambulatory Visit
Admission: RE | Admit: 2024-05-21 | Discharge: 2024-05-21 | Disposition: A | Source: Ambulatory Visit | Attending: Obstetrics and Gynecology | Admitting: Obstetrics and Gynecology

## 2024-05-21 ENCOUNTER — Ambulatory Visit
Admission: RE | Admit: 2024-05-21 | Discharge: 2024-05-21 | Disposition: A | Discharge status: Home / Self Care | Source: Ambulatory Visit | Attending: Obstetrics and Gynecology | Admitting: Obstetrics and Gynecology

## 2024-05-21 ENCOUNTER — Other Ambulatory Visit: Payer: Self-pay | Admitting: Obstetrics and Gynecology

## 2024-05-21 DIAGNOSIS — Z87898 Personal history of other specified conditions: Secondary | ICD-10-CM

## 2024-05-21 DIAGNOSIS — R928 Other abnormal and inconclusive findings on diagnostic imaging of breast: Secondary | ICD-10-CM
